# Patient Record
Sex: Female | Born: 1950 | Race: White | Hispanic: No | Marital: Married | Smoking: Never smoker
Health system: Southern US, Community
[De-identification: ages and names within clinical notes are randomized; demographics above are authoritative.]

## PROBLEM LIST (undated history)

## (undated) DIAGNOSIS — Z8619 Personal history of other infectious and parasitic diseases: Secondary | ICD-10-CM

## (undated) DIAGNOSIS — E7800 Pure hypercholesterolemia, unspecified: Secondary | ICD-10-CM

## (undated) DIAGNOSIS — M1990 Unspecified osteoarthritis, unspecified site: Secondary | ICD-10-CM

## (undated) DIAGNOSIS — I2510 Atherosclerotic heart disease of native coronary artery without angina pectoris: Secondary | ICD-10-CM

## (undated) DIAGNOSIS — H18519 Endothelial corneal dystrophy, unspecified eye: Secondary | ICD-10-CM

## (undated) DIAGNOSIS — I10 Essential (primary) hypertension: Secondary | ICD-10-CM

## (undated) DIAGNOSIS — J45909 Unspecified asthma, uncomplicated: Secondary | ICD-10-CM

## (undated) DIAGNOSIS — J309 Allergic rhinitis, unspecified: Secondary | ICD-10-CM

## (undated) DIAGNOSIS — E039 Hypothyroidism, unspecified: Secondary | ICD-10-CM

## (undated) HISTORY — PX: TONSILLECTOMY: SUR1361

---

## 1983-05-15 HISTORY — PX: THYROIDECTOMY: SHX17

## 2004-11-23 ENCOUNTER — Ambulatory Visit: Payer: Self-pay

## 2006-02-06 ENCOUNTER — Ambulatory Visit: Payer: Self-pay | Admitting: Gerontology

## 2007-09-24 ENCOUNTER — Ambulatory Visit: Payer: Self-pay | Admitting: Obstetrics and Gynecology

## 2008-12-15 ENCOUNTER — Ambulatory Visit: Payer: Self-pay | Admitting: Obstetrics and Gynecology

## 2009-02-23 ENCOUNTER — Ambulatory Visit: Payer: Self-pay | Admitting: Gastroenterology

## 2009-12-27 ENCOUNTER — Ambulatory Visit: Payer: Self-pay | Admitting: Obstetrics and Gynecology

## 2011-01-23 ENCOUNTER — Ambulatory Visit: Payer: Self-pay | Admitting: Obstetrics and Gynecology

## 2012-01-29 ENCOUNTER — Ambulatory Visit: Payer: Self-pay | Admitting: Obstetrics and Gynecology

## 2013-02-11 ENCOUNTER — Ambulatory Visit: Payer: Self-pay | Admitting: Obstetrics and Gynecology

## 2014-05-03 ENCOUNTER — Ambulatory Visit: Payer: Self-pay | Admitting: Obstetrics and Gynecology

## 2015-03-31 ENCOUNTER — Other Ambulatory Visit: Payer: Self-pay | Admitting: Obstetrics and Gynecology

## 2015-03-31 DIAGNOSIS — Z1231 Encounter for screening mammogram for malignant neoplasm of breast: Secondary | ICD-10-CM

## 2015-05-05 ENCOUNTER — Ambulatory Visit
Admission: RE | Admit: 2015-05-05 | Discharge: 2015-05-05 | Disposition: A | Discharge status: Home / Self Care | Payer: Managed Care, Other (non HMO) | Source: Ambulatory Visit | Attending: Obstetrics and Gynecology | Admitting: Obstetrics and Gynecology

## 2015-05-05 DIAGNOSIS — Z1231 Encounter for screening mammogram for malignant neoplasm of breast: Secondary | ICD-10-CM | POA: Diagnosis not present

## 2016-04-23 ENCOUNTER — Other Ambulatory Visit: Payer: Self-pay | Admitting: Obstetrics and Gynecology

## 2016-04-23 DIAGNOSIS — Z1231 Encounter for screening mammogram for malignant neoplasm of breast: Secondary | ICD-10-CM

## 2016-05-31 ENCOUNTER — Ambulatory Visit: Payer: Managed Care, Other (non HMO)

## 2016-06-22 ENCOUNTER — Ambulatory Visit
Admission: RE | Admit: 2016-06-22 | Discharge: 2016-06-22 | Disposition: A | Discharge status: Home / Self Care | Payer: Managed Care, Other (non HMO) | Source: Ambulatory Visit | Attending: Obstetrics and Gynecology | Admitting: Obstetrics and Gynecology

## 2016-06-22 DIAGNOSIS — Z1231 Encounter for screening mammogram for malignant neoplasm of breast: Secondary | ICD-10-CM | POA: Insufficient documentation

## 2017-05-16 DIAGNOSIS — J3081 Allergic rhinitis due to animal (cat) (dog) hair and dander: Secondary | ICD-10-CM | POA: Diagnosis not present

## 2017-05-16 DIAGNOSIS — J301 Allergic rhinitis due to pollen: Secondary | ICD-10-CM | POA: Diagnosis not present

## 2017-05-16 DIAGNOSIS — J3089 Other allergic rhinitis: Secondary | ICD-10-CM | POA: Diagnosis not present

## 2017-05-24 DIAGNOSIS — J3081 Allergic rhinitis due to animal (cat) (dog) hair and dander: Secondary | ICD-10-CM | POA: Diagnosis not present

## 2017-05-24 DIAGNOSIS — J3089 Other allergic rhinitis: Secondary | ICD-10-CM | POA: Diagnosis not present

## 2017-05-24 DIAGNOSIS — J301 Allergic rhinitis due to pollen: Secondary | ICD-10-CM | POA: Diagnosis not present

## 2017-05-27 ENCOUNTER — Other Ambulatory Visit: Payer: Self-pay | Admitting: Family Medicine

## 2017-05-27 DIAGNOSIS — Z1231 Encounter for screening mammogram for malignant neoplasm of breast: Secondary | ICD-10-CM

## 2017-05-31 DIAGNOSIS — J301 Allergic rhinitis due to pollen: Secondary | ICD-10-CM | POA: Diagnosis not present

## 2017-05-31 DIAGNOSIS — J3081 Allergic rhinitis due to animal (cat) (dog) hair and dander: Secondary | ICD-10-CM | POA: Diagnosis not present

## 2017-05-31 DIAGNOSIS — J3089 Other allergic rhinitis: Secondary | ICD-10-CM | POA: Diagnosis not present

## 2017-06-07 DIAGNOSIS — J3081 Allergic rhinitis due to animal (cat) (dog) hair and dander: Secondary | ICD-10-CM | POA: Diagnosis not present

## 2017-06-07 DIAGNOSIS — J3089 Other allergic rhinitis: Secondary | ICD-10-CM | POA: Diagnosis not present

## 2017-06-07 DIAGNOSIS — J301 Allergic rhinitis due to pollen: Secondary | ICD-10-CM | POA: Diagnosis not present

## 2017-06-21 DIAGNOSIS — J3081 Allergic rhinitis due to animal (cat) (dog) hair and dander: Secondary | ICD-10-CM | POA: Diagnosis not present

## 2017-06-21 DIAGNOSIS — J301 Allergic rhinitis due to pollen: Secondary | ICD-10-CM | POA: Diagnosis not present

## 2017-06-21 DIAGNOSIS — J3089 Other allergic rhinitis: Secondary | ICD-10-CM | POA: Diagnosis not present

## 2017-06-24 ENCOUNTER — Ambulatory Visit
Admission: RE | Admit: 2017-06-24 | Discharge: 2017-06-24 | Disposition: A | Discharge status: Home / Self Care | Payer: PPO | Source: Ambulatory Visit | Attending: Family Medicine | Admitting: Family Medicine

## 2017-06-24 DIAGNOSIS — Z1231 Encounter for screening mammogram for malignant neoplasm of breast: Secondary | ICD-10-CM | POA: Diagnosis not present

## 2017-06-28 DIAGNOSIS — J301 Allergic rhinitis due to pollen: Secondary | ICD-10-CM | POA: Diagnosis not present

## 2017-06-28 DIAGNOSIS — J3089 Other allergic rhinitis: Secondary | ICD-10-CM | POA: Diagnosis not present

## 2017-06-28 DIAGNOSIS — J3081 Allergic rhinitis due to animal (cat) (dog) hair and dander: Secondary | ICD-10-CM | POA: Diagnosis not present

## 2017-07-05 DIAGNOSIS — J301 Allergic rhinitis due to pollen: Secondary | ICD-10-CM | POA: Diagnosis not present

## 2017-07-05 DIAGNOSIS — J3081 Allergic rhinitis due to animal (cat) (dog) hair and dander: Secondary | ICD-10-CM | POA: Diagnosis not present

## 2017-07-05 DIAGNOSIS — J3089 Other allergic rhinitis: Secondary | ICD-10-CM | POA: Diagnosis not present

## 2017-07-12 DIAGNOSIS — J301 Allergic rhinitis due to pollen: Secondary | ICD-10-CM | POA: Diagnosis not present

## 2017-07-12 DIAGNOSIS — J3089 Other allergic rhinitis: Secondary | ICD-10-CM | POA: Diagnosis not present

## 2017-07-12 DIAGNOSIS — J3081 Allergic rhinitis due to animal (cat) (dog) hair and dander: Secondary | ICD-10-CM | POA: Diagnosis not present

## 2017-07-26 DIAGNOSIS — J3081 Allergic rhinitis due to animal (cat) (dog) hair and dander: Secondary | ICD-10-CM | POA: Diagnosis not present

## 2017-07-26 DIAGNOSIS — J301 Allergic rhinitis due to pollen: Secondary | ICD-10-CM | POA: Diagnosis not present

## 2017-07-26 DIAGNOSIS — J3089 Other allergic rhinitis: Secondary | ICD-10-CM | POA: Diagnosis not present

## 2017-08-02 DIAGNOSIS — J3081 Allergic rhinitis due to animal (cat) (dog) hair and dander: Secondary | ICD-10-CM | POA: Diagnosis not present

## 2017-08-02 DIAGNOSIS — J3089 Other allergic rhinitis: Secondary | ICD-10-CM | POA: Diagnosis not present

## 2017-08-02 DIAGNOSIS — J301 Allergic rhinitis due to pollen: Secondary | ICD-10-CM | POA: Diagnosis not present

## 2017-08-08 DIAGNOSIS — J301 Allergic rhinitis due to pollen: Secondary | ICD-10-CM | POA: Diagnosis not present

## 2017-08-08 DIAGNOSIS — J3089 Other allergic rhinitis: Secondary | ICD-10-CM | POA: Diagnosis not present

## 2017-08-08 DIAGNOSIS — J3081 Allergic rhinitis due to animal (cat) (dog) hair and dander: Secondary | ICD-10-CM | POA: Diagnosis not present

## 2017-08-16 DIAGNOSIS — J3089 Other allergic rhinitis: Secondary | ICD-10-CM | POA: Diagnosis not present

## 2017-08-16 DIAGNOSIS — J301 Allergic rhinitis due to pollen: Secondary | ICD-10-CM | POA: Diagnosis not present

## 2017-08-16 DIAGNOSIS — J3081 Allergic rhinitis due to animal (cat) (dog) hair and dander: Secondary | ICD-10-CM | POA: Diagnosis not present

## 2017-08-19 DIAGNOSIS — J3089 Other allergic rhinitis: Secondary | ICD-10-CM | POA: Diagnosis not present

## 2017-08-19 DIAGNOSIS — J3081 Allergic rhinitis due to animal (cat) (dog) hair and dander: Secondary | ICD-10-CM | POA: Diagnosis not present

## 2017-08-19 DIAGNOSIS — J301 Allergic rhinitis due to pollen: Secondary | ICD-10-CM | POA: Diagnosis not present

## 2017-08-23 DIAGNOSIS — J3081 Allergic rhinitis due to animal (cat) (dog) hair and dander: Secondary | ICD-10-CM | POA: Diagnosis not present

## 2017-08-23 DIAGNOSIS — R002 Palpitations: Secondary | ICD-10-CM | POA: Diagnosis not present

## 2017-08-23 DIAGNOSIS — R0789 Other chest pain: Secondary | ICD-10-CM | POA: Diagnosis not present

## 2017-08-23 DIAGNOSIS — J3089 Other allergic rhinitis: Secondary | ICD-10-CM | POA: Diagnosis not present

## 2017-08-23 DIAGNOSIS — R000 Tachycardia, unspecified: Secondary | ICD-10-CM | POA: Diagnosis not present

## 2017-08-23 DIAGNOSIS — E039 Hypothyroidism, unspecified: Secondary | ICD-10-CM | POA: Diagnosis not present

## 2017-08-23 DIAGNOSIS — J301 Allergic rhinitis due to pollen: Secondary | ICD-10-CM | POA: Diagnosis not present

## 2017-08-29 DIAGNOSIS — J3081 Allergic rhinitis due to animal (cat) (dog) hair and dander: Secondary | ICD-10-CM | POA: Diagnosis not present

## 2017-08-29 DIAGNOSIS — J3089 Other allergic rhinitis: Secondary | ICD-10-CM | POA: Diagnosis not present

## 2017-08-29 DIAGNOSIS — J301 Allergic rhinitis due to pollen: Secondary | ICD-10-CM | POA: Diagnosis not present

## 2017-09-05 DIAGNOSIS — J3081 Allergic rhinitis due to animal (cat) (dog) hair and dander: Secondary | ICD-10-CM | POA: Diagnosis not present

## 2017-09-05 DIAGNOSIS — J3089 Other allergic rhinitis: Secondary | ICD-10-CM | POA: Diagnosis not present

## 2017-09-05 DIAGNOSIS — J301 Allergic rhinitis due to pollen: Secondary | ICD-10-CM | POA: Diagnosis not present

## 2017-09-06 DIAGNOSIS — R0789 Other chest pain: Secondary | ICD-10-CM | POA: Diagnosis not present

## 2017-09-06 DIAGNOSIS — R000 Tachycardia, unspecified: Secondary | ICD-10-CM | POA: Diagnosis not present

## 2017-09-06 DIAGNOSIS — R002 Palpitations: Secondary | ICD-10-CM | POA: Diagnosis not present

## 2017-09-11 DIAGNOSIS — E7800 Pure hypercholesterolemia, unspecified: Secondary | ICD-10-CM | POA: Diagnosis not present

## 2017-09-11 DIAGNOSIS — I10 Essential (primary) hypertension: Secondary | ICD-10-CM | POA: Diagnosis not present

## 2017-09-12 ENCOUNTER — Other Ambulatory Visit: Payer: Self-pay | Admitting: Cardiology

## 2017-09-13 DIAGNOSIS — J301 Allergic rhinitis due to pollen: Secondary | ICD-10-CM | POA: Diagnosis not present

## 2017-09-13 DIAGNOSIS — J3089 Other allergic rhinitis: Secondary | ICD-10-CM | POA: Diagnosis not present

## 2017-09-13 DIAGNOSIS — J3081 Allergic rhinitis due to animal (cat) (dog) hair and dander: Secondary | ICD-10-CM | POA: Diagnosis not present

## 2017-09-17 ENCOUNTER — Ambulatory Visit
Admission: RE | Admit: 2017-09-17 | Discharge: 2017-09-17 | Disposition: A | Discharge status: Home / Self Care | Payer: PPO | Source: Ambulatory Visit | Attending: Cardiology | Admitting: Cardiology

## 2017-09-17 ENCOUNTER — Encounter
Admission: RE | Disposition: A | Discharge status: Home / Self Care | Payer: Self-pay | Source: Ambulatory Visit | Attending: Cardiology

## 2017-09-17 DIAGNOSIS — J45909 Unspecified asthma, uncomplicated: Secondary | ICD-10-CM | POA: Diagnosis not present

## 2017-09-17 DIAGNOSIS — H1851 Endothelial corneal dystrophy: Secondary | ICD-10-CM | POA: Diagnosis not present

## 2017-09-17 DIAGNOSIS — Z79899 Other long term (current) drug therapy: Secondary | ICD-10-CM | POA: Diagnosis not present

## 2017-09-17 DIAGNOSIS — Z79890 Hormone replacement therapy (postmenopausal): Secondary | ICD-10-CM | POA: Diagnosis not present

## 2017-09-17 DIAGNOSIS — E039 Hypothyroidism, unspecified: Secondary | ICD-10-CM | POA: Diagnosis not present

## 2017-09-17 DIAGNOSIS — I10 Essential (primary) hypertension: Secondary | ICD-10-CM | POA: Diagnosis not present

## 2017-09-17 DIAGNOSIS — R0789 Other chest pain: Secondary | ICD-10-CM | POA: Diagnosis not present

## 2017-09-17 DIAGNOSIS — Z8619 Personal history of other infectious and parasitic diseases: Secondary | ICD-10-CM | POA: Insufficient documentation

## 2017-09-17 DIAGNOSIS — R9430 Abnormal result of cardiovascular function study, unspecified: Secondary | ICD-10-CM | POA: Insufficient documentation

## 2017-09-17 DIAGNOSIS — E7800 Pure hypercholesterolemia, unspecified: Secondary | ICD-10-CM | POA: Insufficient documentation

## 2017-09-17 DIAGNOSIS — Z8249 Family history of ischemic heart disease and other diseases of the circulatory system: Secondary | ICD-10-CM | POA: Diagnosis not present

## 2017-09-17 DIAGNOSIS — Z9889 Other specified postprocedural states: Secondary | ICD-10-CM | POA: Insufficient documentation

## 2017-09-17 DIAGNOSIS — R079 Chest pain, unspecified: Secondary | ICD-10-CM | POA: Diagnosis not present

## 2017-09-17 HISTORY — PX: LEFT HEART CATH AND CORONARY ANGIOGRAPHY: CATH118249

## 2017-09-17 SURGERY — LEFT HEART CATH AND CORONARY ANGIOGRAPHY
Anesthesia: Moderate Sedation | Laterality: Left

## 2017-09-17 MED ORDER — SODIUM CHLORIDE 0.9 % IV SOLN: 250.0000 mL | INTRAVENOUS | Status: DC | PRN

## 2017-09-17 MED ORDER — FENTANYL CITRATE (PF) 100 MCG/2ML IJ SOLN
INTRAMUSCULAR | Status: AC
  Filled 2017-09-17: qty 2

## 2017-09-17 MED ORDER — SODIUM CHLORIDE 0.9% FLUSH: 3.0000 mL | Freq: Two times a day (BID) | INTRAVENOUS | Status: DC

## 2017-09-17 MED ORDER — ASPIRIN 81 MG PO CHEW
CHEWABLE_TABLET | ORAL | Status: AC
  Administered 2017-09-17: 81 mg
  Filled 2017-09-17: qty 1

## 2017-09-17 MED ORDER — SODIUM CHLORIDE 0.9% FLUSH: 3.0000 mL | INTRAVENOUS | Status: DC | PRN

## 2017-09-17 MED ORDER — LIDOCAINE HCL (PF) 1 % IJ SOLN
INTRAMUSCULAR | Status: AC
  Filled 2017-09-17: qty 30

## 2017-09-17 MED ORDER — FENTANYL CITRATE (PF) 100 MCG/2ML IJ SOLN
INTRAMUSCULAR | Status: DC | PRN
  Administered 2017-09-17: 25 ug via INTRAVENOUS

## 2017-09-17 MED ORDER — MIDAZOLAM HCL 2 MG/2ML IJ SOLN
INTRAMUSCULAR | Status: AC
  Filled 2017-09-17: qty 2

## 2017-09-17 MED ORDER — SODIUM CHLORIDE 0.9 % WEIGHT BASED INFUSION
3.0000 mL/kg/h | INTRAVENOUS | Status: AC
  Administered 2017-09-17: 3 mL/kg/h via INTRAVENOUS

## 2017-09-17 MED ORDER — ONDANSETRON HCL 4 MG/2ML IJ SOLN: 4.0000 mg | Freq: Four times a day (QID) | INTRAMUSCULAR | Status: DC | PRN

## 2017-09-17 MED ORDER — MIDAZOLAM HCL 2 MG/2ML IJ SOLN
INTRAMUSCULAR | Status: DC | PRN
  Administered 2017-09-17: 1 mg via INTRAVENOUS

## 2017-09-17 MED ORDER — LIDOCAINE HCL (PF) 1 % IJ SOLN
INTRAMUSCULAR | Status: DC | PRN
  Administered 2017-09-17: 18 mL via INTRADERMAL

## 2017-09-17 MED ORDER — IOPAMIDOL (ISOVUE-300) INJECTION 61%
INTRAVENOUS | Status: DC | PRN
  Administered 2017-09-17: 60 mL via INTRA_ARTERIAL

## 2017-09-17 MED ORDER — SODIUM CHLORIDE 0.9 % WEIGHT BASED INFUSION: 1.0000 mL/kg/h | INTRAVENOUS | Status: DC

## 2017-09-17 MED ORDER — ASPIRIN 81 MG PO CHEW: 81.0000 mg | CHEWABLE_TABLET | ORAL | Status: DC

## 2017-09-17 MED ORDER — ACETAMINOPHEN 325 MG PO TABS: 650.0000 mg | ORAL_TABLET | ORAL | Status: DC | PRN

## 2017-09-17 MED ORDER — HEPARIN (PORCINE) IN NACL 1000-0.9 UT/500ML-% IV SOLN
INTRAVENOUS | Status: AC
  Filled 2017-09-17: qty 1000

## 2017-09-17 SURGICAL SUPPLY — 9 items
CATH INFINITI 5FR ANG PIGTAIL (CATHETERS) ×3 IMPLANT
CATH INFINITI 5FR JL4 (CATHETERS) ×3 IMPLANT
CATH INFINITI JR4 5F (CATHETERS) ×3 IMPLANT
DEVICE CLOSURE MYNXGRIP 5F (Vascular Products) ×3 IMPLANT
KIT MANI 3VAL PERCEP (MISCELLANEOUS) ×3 IMPLANT
NEEDLE PERC 18GX7CM (NEEDLE) ×3 IMPLANT
PACK CARDIAC CATH (CUSTOM PROCEDURE TRAY) ×3 IMPLANT
SHEATH AVANTI 5FR X 11CM (SHEATH) ×3 IMPLANT
WIRE GUIDERIGHT .035X150 (WIRE) ×3 IMPLANT

## 2017-09-17 NOTE — Discharge Instructions (Signed)
°Moderate Conscious Sedation, Adult, Care After °These instructions provide you with information about caring for yourself after your procedure. Your health care provider may also give you more specific instructions. Your treatment has been planned according to current medical practices, but problems sometimes occur. Call your health care provider if you have any problems or questions after your procedure. °What can I expect after the procedure? °After your procedure, it is common: °· To feel sleepy for several hours. °· To feel clumsy and have poor balance for several hours. °· To have poor judgment for several hours. °· To vomit if you eat too soon. ° °Follow these instructions at home: °For at least 24 hours after the procedure: ° °· Do not: °? Participate in activities where you could fall or become injured. °? Drive. °? Use heavy machinery. °? Drink alcohol. °? Take sleeping pills or medicines that cause drowsiness. °? Make important decisions or sign legal documents. °? Take care of children on your own. °· Rest. °Eating and drinking °· Follow the diet recommended by your health care provider. °· If you vomit: °? Drink water, juice, or soup when you can drink without vomiting. °? Make sure you have little or no nausea before eating solid foods. °General instructions °· Have a responsible adult stay with you until you are awake and alert. °· Take over-the-counter and prescription medicines only as told by your health care provider. °· If you smoke, do not smoke without supervision. °· Keep all follow-up visits as told by your health care provider. This is important. °Contact a health care provider if: °· You keep feeling nauseous or you keep vomiting. °· You feel light-headed. °· You develop a rash. °· You have a fever. °Get help right away if: °· You have trouble breathing. °This information is not intended to replace advice given to you by your health care provider. Make sure you discuss any questions you  have with your health care provider. °Document Released: 02/18/2013 Document Revised: 10/03/2015 Document Reviewed: 08/20/2015 °Elsevier Interactive Patient Education © 2018 [employer] ° ° ° °Femoral Site Care °Refer to this sheet in the next few weeks. These instructions provide you with information about caring for yourself after your procedure. Your health care provider may also give you more specific instructions. Your treatment has been planned according to current medical practices, but problems sometimes occur. Call your health care provider if you have any problems or questions after your procedure. °What can I expect after the procedure? °After your procedure, it is typical to have the following: °· Bruising at the site that usually fades within 1-2 weeks. °· Blood collecting in the tissue (hematoma) that may be painful to the touch. It should usually decrease in size and tenderness within 1-2 weeks. ° °Follow these instructions at home: °· Take medicines only as directed by your health care provider. °· You may shower 24-48 hours after the procedure or as directed by your health care provider. Remove the bandage (dressing) and gently wash the site with plain soap and water. Pat the area dry with a clean towel. Do not rub the site, because this may cause bleeding. °· Do not take baths, swim, or use a hot tub until your health care provider approves. °· Check your insertion site every day for redness, swelling, or drainage. °· Do not apply powder or lotion to the site. °· Limit use of stairs to twice a day for the first 2-3 days or as directed by your health care   provider. °· Do not squat for the first 2-3 days or as directed by your health care provider. °· Do not lift over 10 lb (4.5 kg) for 5 days after your procedure or as directed by your health care provider. °· Ask your health care provider when it is okay to: °? Return to work or school. °? Resume usual physical activities or sports. °? Resume  sexual activity. °· Do not drive home if you are discharged the same day as the procedure. Have someone else drive you. °· You may drive 24 hours after the procedure unless otherwise instructed by your health care provider. °· Do not operate machinery or power tools for 24 hours after the procedure or as directed by your health care provider. °· If your procedure was done as an outpatient procedure, which means that you went home the same day as your procedure, a responsible adult should be with you for the first 24 hours after you arrive home. °· Keep all follow-up visits as directed by your health care provider. This is important. °Contact a health care provider if: °· You have a fever. °· You have chills. °· You have increased bleeding from the site. Hold pressure on the site. °Get help right away if: °· You have unusual pain at the site. °· You have redness, warmth, or swelling at the site. °· You have drainage (other than a small amount of blood on the dressing) from the site. °· The site is bleeding, and the bleeding does not stop after 30 minutes of holding steady pressure on the site. °· Your leg or foot becomes pale, cool, tingly, or numb. °This information is not intended to replace advice given to you by your health care provider. Make sure you discuss any questions you have with your health care provider. °Document Released: 01/01/2014 Document Revised: 10/06/2015 Document Reviewed: 11/17/2013 °Elsevier Interactive Patient Education © 2018 Elsevier Inc. ° °

## 2017-09-17 NOTE — H&P (Signed)
Chief Complaint: Chief Complaint  Patient presents with  . Establish Care  Abnormal Stress/Echo per Mimi  Date of Service: 09/11/2017 Date of Birth: [birth year] PCP: Dion Body, MD  History of Present Illness: Terri Jefferson is a 67 y.o.female patient who presents in referral for evaluation after being noted to have an abnormal knee. She recently has been noted to have a relative reduced TSH level with some tachycardia. She has noticed some chest tightness both with tachycardia as well as otherwise. She underwent an exercise stress echo which was read as showing inferior and inferoapical hypokinesis with stress. She has been placed on metoprolol with some improvement. She is also on ACE inhibitor for blood pressure. She has a strong family history of heart disease. She denies tobacco use.  Past Medical and Surgical History  Past Medical History Past Medical History:  Diagnosis Date  . Acquired hypothyroidism, unspecified  . Allergic state  . Asthma without status asthmaticus, unspecified  . Essential hypertension  . Fuchs' corneal dystrophy  Followed by [facility]  . History of shingles  . Pure hypercholesterolemia  . Seasonal allergies   Past Surgical History She has a past surgical history that includes thyroidectomy total; tonsilectomy; Tonsillectomy; and Colonoscopy 02/2009.   Medications and Allergies  Current Medications  Current Outpatient Medications  Medication Sig Dispense Refill  . atorvastatin (LIPITOR) 10 MG tablet TAKE 1 TABLET BY MOUTH DAILY 90 tablet 1  . Lactobac no.41/Bifidobact no.7 (PROBIOTIC-10 ORAL) Take 1 tablet by mouth once daily  . levothyroxine (SYNTHROID, LEVOTHROID) 50 MCG tablet Take 1 tablet (50 mcg total) by mouth once daily Take on an empty stomach with a glass of water at least 30-60 minutes before breakfast. 30 tablet 11  . lisinopril-hydrochlorothiazide (PRINZIDE,ZESTORETIC) 10-12.5 mg tablet TAKE 1 TABLET BY MOUTH DAILY 90 tablet 1   . loratadine (ALAVERT) 10 mg dissolvable tablet Take 10 mg by mouth once daily.  . metoprolol succinate (TOPROL-XL) 25 MG XL tablet Take 1 tablet (25 mg total) by mouth once daily 30 tablet 5  . multivitamin tablet Take 1 tablet by mouth once daily.  . polyethylene glycol (MIRALAX) powder Take 17 g by mouth once daily as needed for Constipation. Mix in 4-8ounces of fluid prior to taking.  Marland Kitchen UNABLE TO FIND Allergy Injections qweek  . nitroGLYcerin (NITROSTAT) 0.4 MG SL tablet Place 1 tablet (0.4 mg total) under the tongue every 5 (five) minutes as needed for Chest pain May take up to 3 doses. 25 tablet 11   No current facility-administered medications for this visit.   Allergies: Patient has no known allergies.  Social and Family History  Social History reports that she has never smoked. She has never used smokeless tobacco. She reports that she drinks alcohol. She reports that she does not use drugs.  Family History Family History  Problem Relation Age of Onset  . Ovarian cancer Maternal Aunt  . Heart disease Mother  . High blood pressure (Hypertension) Mother   Review of Systems  Review of Systems  Constitutional: Negative for chills, diaphoresis, fever, malaise/fatigue and weight loss.  HENT: Negative for congestion, ear discharge, hearing loss and tinnitus.  Eyes: Negative for blurred vision.  Respiratory: Negative for cough, hemoptysis, sputum production, shortness of breath and wheezing.  Cardiovascular: Positive for chest pain and palpitations. Negative for orthopnea, claudication, leg swelling and PND.  Gastrointestinal: Negative for abdominal pain, blood in stool, constipation, diarrhea, heartburn, melena, nausea and vomiting.  Genitourinary: Negative for dysuria, frequency, hematuria and  urgency.  Musculoskeletal: Negative for back pain, falls, joint pain and myalgias.  Skin: Negative for itching and rash.  Neurological: Negative for dizziness, tingling, focal weakness,  loss of consciousness, weakness and headaches.  Endo/Heme/Allergies: Negative for polydipsia. Does not bruise/bleed easily.  Psychiatric/Behavioral: Negative for depression, memory loss and substance abuse. The patient is not nervous/anxious.   Physical Examination   Vitals:BP 124/78  Pulse 84  Ht 160 cm (5\' 3" )  Wt 63.1 kg (139 lb 3.2 oz)  BMI 24.66 kg/m  Ht:160 cm (5\' 3" ) Wt:63.1 kg (139 lb 3.2 oz) SWN:IOEV surface area is 1.67 meters squared. Body mass index is 24.66 kg/m.  Wt Readings from Last 3 Encounters:  09/11/17 63.1 kg (139 lb 3.2 oz)  08/23/17 65 kg (143 lb 3.2 oz)  04/09/17 67.5 kg (148 lb 12.8 oz)   BP Readings from Last 3 Encounters:  09/11/17 124/78  08/23/17 138/82  04/09/17 146/76   General appearance appears in no acute distress  Head Mouth and Eye exam Normocephalic, without obvious abnormality, atraumatic Dentition is good Eyes appear anicteric   Neck exam Thyroid: normal  Nodes: no obvious adenopathy  LUNGS Breath Sounds: Normal Percussion: Normal  CARDIOVASCULAR JVP CV wave: no HJR: no Elevation at 90 degrees: None Carotid Pulse: normal pulsation bilaterally Bruit: None Apex: apical impulse normal  Auscultation Rhythm: normal sinus rhythm S1: normal S2: normal Clicks: no Rub: no Murmurs: no murmurs  Gallop: None ABDOMEN Liver enlargement: no Pulsatile aorta: no Ascites: no Bruits: no  EXTREMITIES Clubbing: no Edema: trace to 1+ bilateral pedal edema Pulses: peripheral pulses symmetrical Femoral Bruits: no Amputation: no SKIN Rash: no Cyanosis: no Embolic phemonenon: no Bruising: no NEURO Alert and Oriented to person, place and time: yes Non focal: yes  PSYCH: Pt appears to have normal affect  LABS REVIEWED Last 3 CBC results: Lab Results  Component Value Date  WBC 7.2 08/23/2017  WBC 7.6 04/02/2017  WBC 11.7 (H) 07/30/2016   Lab Results  Component Value Date  HGB 13.0 08/23/2017  HGB 12.8 04/02/2017   HGB 12.6 07/30/2016   Lab Results  Component Value Date  HCT 38.6 08/23/2017  HCT 38.0 04/02/2017  HCT 36.1 07/30/2016   Lab Results  Component Value Date  PLT 256 08/23/2017  PLT 241 04/02/2017  PLT 263 07/30/2016   Lab Results  Component Value Date  CREATININE 1.0 08/23/2017  BUN 21 08/23/2017  NA 137 08/23/2017  K 4.1 08/23/2017  CL 103 08/23/2017  CO2 26.5 08/23/2017   Lab Results  Component Value Date  HGBA1C 5.7 (H) 04/02/2017   Lab Results  Component Value Date  HDL 58.4 04/02/2017  HDL 55.9 09/27/2016  HDL 61.3 03/30/2016   Lab Results  Component Value Date  LDLCALC 97 04/02/2017  LDLCALC 98 09/27/2016  LDLCALC 118 03/30/2016   Lab Results  Component Value Date  TRIG 138 04/02/2017  TRIG 120 09/27/2016  TRIG 76 03/30/2016   Lab Results  Component Value Date  ALT 14 08/23/2017  AST 19 08/23/2017  ALKPHOS 44 08/23/2017   Lab Results  Component Value Date  TSH 0.309 (L) 08/23/2017   Diagnostic Studies Reviewed:  EKG EKG demonstrated normal sinus rhythm, nonspecific ST and T waves changes.  Assessment and Plan   67 y.o. female with  ICD-10-CM ICD-9-CM  1. Pure hypercholesterolemia (LDL 97 - 04/02/17)-LDL goal less than 100. Recommend staying on atorvastatin at 10 mg daily and low-fat diet. E78.00 272.0  2. Essential hypertension-remain on metoprolol and lisinopril-hydrochlorthiazide. Low-sodium diet  recommended. I10 401.9   3. Chest pain-etiology unclear. Stress echo read as showing inferior ischemia. Given strong family history as well as hypertension hyperlipidemia, will proceed with left heart cath to evaluate coronary anatomy guide further therapy. Risk and benefits were explained to the patient. We will give her sublingual nitro to take in addition to metoprolol for antianginal therapy. Further recognitions after cardiac cath.  Return in about 1 month (around 10/09/2017).  These notes generated with voice recognition software. I  apologize for typographical errors.  Sydnee Levans, MD    Pt seen and examined. No change from above.

## 2017-09-20 DIAGNOSIS — J301 Allergic rhinitis due to pollen: Secondary | ICD-10-CM | POA: Diagnosis not present

## 2017-09-20 DIAGNOSIS — J3081 Allergic rhinitis due to animal (cat) (dog) hair and dander: Secondary | ICD-10-CM | POA: Diagnosis not present

## 2017-09-20 DIAGNOSIS — J3089 Other allergic rhinitis: Secondary | ICD-10-CM | POA: Diagnosis not present

## 2017-09-23 DIAGNOSIS — I10 Essential (primary) hypertension: Secondary | ICD-10-CM | POA: Diagnosis not present

## 2017-09-23 DIAGNOSIS — E7800 Pure hypercholesterolemia, unspecified: Secondary | ICD-10-CM | POA: Diagnosis not present

## 2017-09-27 DIAGNOSIS — J301 Allergic rhinitis due to pollen: Secondary | ICD-10-CM | POA: Diagnosis not present

## 2017-09-27 DIAGNOSIS — J3081 Allergic rhinitis due to animal (cat) (dog) hair and dander: Secondary | ICD-10-CM | POA: Diagnosis not present

## 2017-09-27 DIAGNOSIS — J3089 Other allergic rhinitis: Secondary | ICD-10-CM | POA: Diagnosis not present

## 2017-10-03 DIAGNOSIS — R7303 Prediabetes: Secondary | ICD-10-CM | POA: Diagnosis not present

## 2017-10-03 DIAGNOSIS — E7800 Pure hypercholesterolemia, unspecified: Secondary | ICD-10-CM | POA: Diagnosis not present

## 2017-10-04 DIAGNOSIS — J3081 Allergic rhinitis due to animal (cat) (dog) hair and dander: Secondary | ICD-10-CM | POA: Diagnosis not present

## 2017-10-04 DIAGNOSIS — J301 Allergic rhinitis due to pollen: Secondary | ICD-10-CM | POA: Diagnosis not present

## 2017-10-04 DIAGNOSIS — J3089 Other allergic rhinitis: Secondary | ICD-10-CM | POA: Diagnosis not present

## 2017-10-10 DIAGNOSIS — J3081 Allergic rhinitis due to animal (cat) (dog) hair and dander: Secondary | ICD-10-CM | POA: Diagnosis not present

## 2017-10-10 DIAGNOSIS — R7303 Prediabetes: Secondary | ICD-10-CM | POA: Diagnosis not present

## 2017-10-10 DIAGNOSIS — E7800 Pure hypercholesterolemia, unspecified: Secondary | ICD-10-CM | POA: Diagnosis not present

## 2017-10-10 DIAGNOSIS — Z0000 Encounter for general adult medical examination without abnormal findings: Secondary | ICD-10-CM | POA: Diagnosis not present

## 2017-10-10 DIAGNOSIS — I10 Essential (primary) hypertension: Secondary | ICD-10-CM | POA: Diagnosis not present

## 2017-10-10 DIAGNOSIS — J3089 Other allergic rhinitis: Secondary | ICD-10-CM | POA: Diagnosis not present

## 2017-10-10 DIAGNOSIS — Z780 Asymptomatic menopausal state: Secondary | ICD-10-CM | POA: Diagnosis not present

## 2017-10-10 DIAGNOSIS — J301 Allergic rhinitis due to pollen: Secondary | ICD-10-CM | POA: Diagnosis not present

## 2017-10-10 DIAGNOSIS — E039 Hypothyroidism, unspecified: Secondary | ICD-10-CM | POA: Diagnosis not present

## 2017-10-18 DIAGNOSIS — J3089 Other allergic rhinitis: Secondary | ICD-10-CM | POA: Diagnosis not present

## 2017-10-18 DIAGNOSIS — J3081 Allergic rhinitis due to animal (cat) (dog) hair and dander: Secondary | ICD-10-CM | POA: Diagnosis not present

## 2017-10-18 DIAGNOSIS — J301 Allergic rhinitis due to pollen: Secondary | ICD-10-CM | POA: Diagnosis not present

## 2017-10-23 DIAGNOSIS — Z780 Asymptomatic menopausal state: Secondary | ICD-10-CM | POA: Diagnosis not present

## 2017-10-25 DIAGNOSIS — J301 Allergic rhinitis due to pollen: Secondary | ICD-10-CM | POA: Diagnosis not present

## 2017-10-25 DIAGNOSIS — J3081 Allergic rhinitis due to animal (cat) (dog) hair and dander: Secondary | ICD-10-CM | POA: Diagnosis not present

## 2017-10-25 DIAGNOSIS — J3089 Other allergic rhinitis: Secondary | ICD-10-CM | POA: Diagnosis not present

## 2017-11-01 DIAGNOSIS — J3089 Other allergic rhinitis: Secondary | ICD-10-CM | POA: Diagnosis not present

## 2017-11-01 DIAGNOSIS — J3081 Allergic rhinitis due to animal (cat) (dog) hair and dander: Secondary | ICD-10-CM | POA: Diagnosis not present

## 2017-11-01 DIAGNOSIS — J301 Allergic rhinitis due to pollen: Secondary | ICD-10-CM | POA: Diagnosis not present

## 2017-11-08 DIAGNOSIS — J3089 Other allergic rhinitis: Secondary | ICD-10-CM | POA: Diagnosis not present

## 2017-11-08 DIAGNOSIS — J3081 Allergic rhinitis due to animal (cat) (dog) hair and dander: Secondary | ICD-10-CM | POA: Diagnosis not present

## 2017-11-08 DIAGNOSIS — J301 Allergic rhinitis due to pollen: Secondary | ICD-10-CM | POA: Diagnosis not present

## 2017-11-12 DIAGNOSIS — J3081 Allergic rhinitis due to animal (cat) (dog) hair and dander: Secondary | ICD-10-CM | POA: Diagnosis not present

## 2017-11-12 DIAGNOSIS — J301 Allergic rhinitis due to pollen: Secondary | ICD-10-CM | POA: Diagnosis not present

## 2017-11-12 DIAGNOSIS — J3089 Other allergic rhinitis: Secondary | ICD-10-CM | POA: Diagnosis not present

## 2017-11-21 DIAGNOSIS — J3081 Allergic rhinitis due to animal (cat) (dog) hair and dander: Secondary | ICD-10-CM | POA: Diagnosis not present

## 2017-11-21 DIAGNOSIS — J301 Allergic rhinitis due to pollen: Secondary | ICD-10-CM | POA: Diagnosis not present

## 2017-11-21 DIAGNOSIS — J3089 Other allergic rhinitis: Secondary | ICD-10-CM | POA: Diagnosis not present

## 2017-11-29 DIAGNOSIS — J3081 Allergic rhinitis due to animal (cat) (dog) hair and dander: Secondary | ICD-10-CM | POA: Diagnosis not present

## 2017-11-29 DIAGNOSIS — J301 Allergic rhinitis due to pollen: Secondary | ICD-10-CM | POA: Diagnosis not present

## 2017-11-29 DIAGNOSIS — J3089 Other allergic rhinitis: Secondary | ICD-10-CM | POA: Diagnosis not present

## 2017-12-06 DIAGNOSIS — J301 Allergic rhinitis due to pollen: Secondary | ICD-10-CM | POA: Diagnosis not present

## 2017-12-06 DIAGNOSIS — J3081 Allergic rhinitis due to animal (cat) (dog) hair and dander: Secondary | ICD-10-CM | POA: Diagnosis not present

## 2017-12-06 DIAGNOSIS — J3089 Other allergic rhinitis: Secondary | ICD-10-CM | POA: Diagnosis not present

## 2017-12-13 DIAGNOSIS — J3081 Allergic rhinitis due to animal (cat) (dog) hair and dander: Secondary | ICD-10-CM | POA: Diagnosis not present

## 2017-12-13 DIAGNOSIS — J301 Allergic rhinitis due to pollen: Secondary | ICD-10-CM | POA: Diagnosis not present

## 2017-12-13 DIAGNOSIS — J3089 Other allergic rhinitis: Secondary | ICD-10-CM | POA: Diagnosis not present

## 2017-12-20 DIAGNOSIS — J3089 Other allergic rhinitis: Secondary | ICD-10-CM | POA: Diagnosis not present

## 2017-12-20 DIAGNOSIS — J3081 Allergic rhinitis due to animal (cat) (dog) hair and dander: Secondary | ICD-10-CM | POA: Diagnosis not present

## 2017-12-20 DIAGNOSIS — J301 Allergic rhinitis due to pollen: Secondary | ICD-10-CM | POA: Diagnosis not present

## 2017-12-27 DIAGNOSIS — J301 Allergic rhinitis due to pollen: Secondary | ICD-10-CM | POA: Diagnosis not present

## 2017-12-27 DIAGNOSIS — J3089 Other allergic rhinitis: Secondary | ICD-10-CM | POA: Diagnosis not present

## 2017-12-27 DIAGNOSIS — J3081 Allergic rhinitis due to animal (cat) (dog) hair and dander: Secondary | ICD-10-CM | POA: Diagnosis not present

## 2017-12-31 ENCOUNTER — Other Ambulatory Visit: Payer: Self-pay | Admitting: *Deleted

## 2017-12-31 NOTE — Patient Outreach (Signed)
Telephone call to pt for HTA health risk assessment completed by pt, spoke with pt, HIPAA verified, pt reports hypertension well controlled, pt sees Dr. Netty Starring at [facility] in [place], pt reports she has no needs for care management, RN CM mailed successful outreach letter including 24 hour nurse line magnet to pt home.  Terri Jefferson [facility], [place] Coordinator [phone]

## 2018-01-03 DIAGNOSIS — J301 Allergic rhinitis due to pollen: Secondary | ICD-10-CM | POA: Diagnosis not present

## 2018-01-03 DIAGNOSIS — J3089 Other allergic rhinitis: Secondary | ICD-10-CM | POA: Diagnosis not present

## 2018-01-03 DIAGNOSIS — J3081 Allergic rhinitis due to animal (cat) (dog) hair and dander: Secondary | ICD-10-CM | POA: Diagnosis not present

## 2018-01-10 DIAGNOSIS — J3081 Allergic rhinitis due to animal (cat) (dog) hair and dander: Secondary | ICD-10-CM | POA: Diagnosis not present

## 2018-01-10 DIAGNOSIS — J3089 Other allergic rhinitis: Secondary | ICD-10-CM | POA: Diagnosis not present

## 2018-01-10 DIAGNOSIS — J301 Allergic rhinitis due to pollen: Secondary | ICD-10-CM | POA: Diagnosis not present

## 2018-01-17 DIAGNOSIS — J3089 Other allergic rhinitis: Secondary | ICD-10-CM | POA: Diagnosis not present

## 2018-01-17 DIAGNOSIS — J301 Allergic rhinitis due to pollen: Secondary | ICD-10-CM | POA: Diagnosis not present

## 2018-01-17 DIAGNOSIS — J3081 Allergic rhinitis due to animal (cat) (dog) hair and dander: Secondary | ICD-10-CM | POA: Diagnosis not present

## 2018-01-28 DIAGNOSIS — J4520 Mild intermittent asthma, uncomplicated: Secondary | ICD-10-CM | POA: Diagnosis not present

## 2018-01-28 DIAGNOSIS — J301 Allergic rhinitis due to pollen: Secondary | ICD-10-CM | POA: Diagnosis not present

## 2018-01-28 DIAGNOSIS — J3081 Allergic rhinitis due to animal (cat) (dog) hair and dander: Secondary | ICD-10-CM | POA: Diagnosis not present

## 2018-01-28 DIAGNOSIS — J3089 Other allergic rhinitis: Secondary | ICD-10-CM | POA: Diagnosis not present

## 2018-02-03 DIAGNOSIS — J3081 Allergic rhinitis due to animal (cat) (dog) hair and dander: Secondary | ICD-10-CM | POA: Diagnosis not present

## 2018-02-03 DIAGNOSIS — J301 Allergic rhinitis due to pollen: Secondary | ICD-10-CM | POA: Diagnosis not present

## 2018-02-03 DIAGNOSIS — J3089 Other allergic rhinitis: Secondary | ICD-10-CM | POA: Diagnosis not present

## 2018-02-04 DIAGNOSIS — Z780 Asymptomatic menopausal state: Secondary | ICD-10-CM | POA: Diagnosis not present

## 2018-02-04 DIAGNOSIS — R7303 Prediabetes: Secondary | ICD-10-CM | POA: Diagnosis not present

## 2018-02-04 DIAGNOSIS — M8580 Other specified disorders of bone density and structure, unspecified site: Secondary | ICD-10-CM | POA: Diagnosis not present

## 2018-02-04 DIAGNOSIS — E7800 Pure hypercholesterolemia, unspecified: Secondary | ICD-10-CM | POA: Diagnosis not present

## 2018-02-04 DIAGNOSIS — I10 Essential (primary) hypertension: Secondary | ICD-10-CM | POA: Diagnosis not present

## 2018-02-07 DIAGNOSIS — J3081 Allergic rhinitis due to animal (cat) (dog) hair and dander: Secondary | ICD-10-CM | POA: Diagnosis not present

## 2018-02-07 DIAGNOSIS — J301 Allergic rhinitis due to pollen: Secondary | ICD-10-CM | POA: Diagnosis not present

## 2018-02-07 DIAGNOSIS — J3089 Other allergic rhinitis: Secondary | ICD-10-CM | POA: Diagnosis not present

## 2018-02-11 DIAGNOSIS — Z23 Encounter for immunization: Secondary | ICD-10-CM | POA: Diagnosis not present

## 2018-02-11 DIAGNOSIS — E039 Hypothyroidism, unspecified: Secondary | ICD-10-CM | POA: Diagnosis not present

## 2018-02-11 DIAGNOSIS — R7303 Prediabetes: Secondary | ICD-10-CM | POA: Diagnosis not present

## 2018-02-11 DIAGNOSIS — I10 Essential (primary) hypertension: Secondary | ICD-10-CM | POA: Diagnosis not present

## 2018-02-11 DIAGNOSIS — E7800 Pure hypercholesterolemia, unspecified: Secondary | ICD-10-CM | POA: Diagnosis not present

## 2018-02-14 DIAGNOSIS — J3089 Other allergic rhinitis: Secondary | ICD-10-CM | POA: Diagnosis not present

## 2018-02-14 DIAGNOSIS — J3081 Allergic rhinitis due to animal (cat) (dog) hair and dander: Secondary | ICD-10-CM | POA: Diagnosis not present

## 2018-02-14 DIAGNOSIS — J301 Allergic rhinitis due to pollen: Secondary | ICD-10-CM | POA: Diagnosis not present

## 2018-03-14 DIAGNOSIS — J301 Allergic rhinitis due to pollen: Secondary | ICD-10-CM | POA: Diagnosis not present

## 2018-03-14 DIAGNOSIS — J3089 Other allergic rhinitis: Secondary | ICD-10-CM | POA: Diagnosis not present

## 2018-03-14 DIAGNOSIS — J3081 Allergic rhinitis due to animal (cat) (dog) hair and dander: Secondary | ICD-10-CM | POA: Diagnosis not present

## 2018-03-21 DIAGNOSIS — J3081 Allergic rhinitis due to animal (cat) (dog) hair and dander: Secondary | ICD-10-CM | POA: Diagnosis not present

## 2018-03-21 DIAGNOSIS — J301 Allergic rhinitis due to pollen: Secondary | ICD-10-CM | POA: Diagnosis not present

## 2018-03-28 DIAGNOSIS — J3089 Other allergic rhinitis: Secondary | ICD-10-CM | POA: Diagnosis not present

## 2018-03-28 DIAGNOSIS — J3081 Allergic rhinitis due to animal (cat) (dog) hair and dander: Secondary | ICD-10-CM | POA: Diagnosis not present

## 2018-03-28 DIAGNOSIS — J301 Allergic rhinitis due to pollen: Secondary | ICD-10-CM | POA: Diagnosis not present

## 2018-04-01 DIAGNOSIS — J301 Allergic rhinitis due to pollen: Secondary | ICD-10-CM | POA: Diagnosis not present

## 2018-04-01 DIAGNOSIS — J3089 Other allergic rhinitis: Secondary | ICD-10-CM | POA: Diagnosis not present

## 2018-04-01 DIAGNOSIS — J3081 Allergic rhinitis due to animal (cat) (dog) hair and dander: Secondary | ICD-10-CM | POA: Diagnosis not present

## 2018-04-04 DIAGNOSIS — J3081 Allergic rhinitis due to animal (cat) (dog) hair and dander: Secondary | ICD-10-CM | POA: Diagnosis not present

## 2018-04-04 DIAGNOSIS — J301 Allergic rhinitis due to pollen: Secondary | ICD-10-CM | POA: Diagnosis not present

## 2018-04-04 DIAGNOSIS — J3089 Other allergic rhinitis: Secondary | ICD-10-CM | POA: Diagnosis not present

## 2018-04-08 DIAGNOSIS — J3089 Other allergic rhinitis: Secondary | ICD-10-CM | POA: Diagnosis not present

## 2018-04-08 DIAGNOSIS — J3081 Allergic rhinitis due to animal (cat) (dog) hair and dander: Secondary | ICD-10-CM | POA: Diagnosis not present

## 2018-04-08 DIAGNOSIS — J301 Allergic rhinitis due to pollen: Secondary | ICD-10-CM | POA: Diagnosis not present

## 2018-04-18 DIAGNOSIS — J3089 Other allergic rhinitis: Secondary | ICD-10-CM | POA: Diagnosis not present

## 2018-04-18 DIAGNOSIS — J301 Allergic rhinitis due to pollen: Secondary | ICD-10-CM | POA: Diagnosis not present

## 2018-04-18 DIAGNOSIS — J3081 Allergic rhinitis due to animal (cat) (dog) hair and dander: Secondary | ICD-10-CM | POA: Diagnosis not present

## 2018-04-25 DIAGNOSIS — J3089 Other allergic rhinitis: Secondary | ICD-10-CM | POA: Diagnosis not present

## 2018-04-25 DIAGNOSIS — J3081 Allergic rhinitis due to animal (cat) (dog) hair and dander: Secondary | ICD-10-CM | POA: Diagnosis not present

## 2018-04-25 DIAGNOSIS — J301 Allergic rhinitis due to pollen: Secondary | ICD-10-CM | POA: Diagnosis not present

## 2018-05-02 DIAGNOSIS — J301 Allergic rhinitis due to pollen: Secondary | ICD-10-CM | POA: Diagnosis not present

## 2018-05-02 DIAGNOSIS — J3081 Allergic rhinitis due to animal (cat) (dog) hair and dander: Secondary | ICD-10-CM | POA: Diagnosis not present

## 2018-05-02 DIAGNOSIS — J3089 Other allergic rhinitis: Secondary | ICD-10-CM | POA: Diagnosis not present

## 2018-05-09 DIAGNOSIS — J301 Allergic rhinitis due to pollen: Secondary | ICD-10-CM | POA: Diagnosis not present

## 2018-05-09 DIAGNOSIS — J3089 Other allergic rhinitis: Secondary | ICD-10-CM | POA: Diagnosis not present

## 2018-05-09 DIAGNOSIS — J3081 Allergic rhinitis due to animal (cat) (dog) hair and dander: Secondary | ICD-10-CM | POA: Diagnosis not present

## 2018-05-16 ENCOUNTER — Other Ambulatory Visit: Payer: Self-pay | Admitting: Family Medicine

## 2018-05-16 DIAGNOSIS — J301 Allergic rhinitis due to pollen: Secondary | ICD-10-CM | POA: Diagnosis not present

## 2018-05-16 DIAGNOSIS — J3089 Other allergic rhinitis: Secondary | ICD-10-CM | POA: Diagnosis not present

## 2018-05-16 DIAGNOSIS — Z1231 Encounter for screening mammogram for malignant neoplasm of breast: Secondary | ICD-10-CM

## 2018-05-23 DIAGNOSIS — J3081 Allergic rhinitis due to animal (cat) (dog) hair and dander: Secondary | ICD-10-CM | POA: Diagnosis not present

## 2018-05-23 DIAGNOSIS — J301 Allergic rhinitis due to pollen: Secondary | ICD-10-CM | POA: Diagnosis not present

## 2018-05-23 DIAGNOSIS — J3089 Other allergic rhinitis: Secondary | ICD-10-CM | POA: Diagnosis not present

## 2018-05-30 DIAGNOSIS — J301 Allergic rhinitis due to pollen: Secondary | ICD-10-CM | POA: Diagnosis not present

## 2018-05-30 DIAGNOSIS — J3089 Other allergic rhinitis: Secondary | ICD-10-CM | POA: Diagnosis not present

## 2018-06-06 DIAGNOSIS — J3081 Allergic rhinitis due to animal (cat) (dog) hair and dander: Secondary | ICD-10-CM | POA: Diagnosis not present

## 2018-06-06 DIAGNOSIS — J3089 Other allergic rhinitis: Secondary | ICD-10-CM | POA: Diagnosis not present

## 2018-06-06 DIAGNOSIS — J301 Allergic rhinitis due to pollen: Secondary | ICD-10-CM | POA: Diagnosis not present

## 2018-06-13 DIAGNOSIS — J301 Allergic rhinitis due to pollen: Secondary | ICD-10-CM | POA: Diagnosis not present

## 2018-06-13 DIAGNOSIS — J3089 Other allergic rhinitis: Secondary | ICD-10-CM | POA: Diagnosis not present

## 2018-06-13 DIAGNOSIS — J3081 Allergic rhinitis due to animal (cat) (dog) hair and dander: Secondary | ICD-10-CM | POA: Diagnosis not present

## 2018-06-20 DIAGNOSIS — J3081 Allergic rhinitis due to animal (cat) (dog) hair and dander: Secondary | ICD-10-CM | POA: Diagnosis not present

## 2018-06-20 DIAGNOSIS — J301 Allergic rhinitis due to pollen: Secondary | ICD-10-CM | POA: Diagnosis not present

## 2018-06-20 DIAGNOSIS — J3089 Other allergic rhinitis: Secondary | ICD-10-CM | POA: Diagnosis not present

## 2018-06-25 ENCOUNTER — Ambulatory Visit
Admission: RE | Admit: 2018-06-25 | Discharge: 2018-06-25 | Disposition: A | Discharge status: Home / Self Care | Payer: PPO | Source: Ambulatory Visit | Attending: Family Medicine | Admitting: Family Medicine

## 2018-06-25 DIAGNOSIS — Z1231 Encounter for screening mammogram for malignant neoplasm of breast: Secondary | ICD-10-CM | POA: Diagnosis not present

## 2018-06-27 DIAGNOSIS — J3089 Other allergic rhinitis: Secondary | ICD-10-CM | POA: Diagnosis not present

## 2018-06-27 DIAGNOSIS — J301 Allergic rhinitis due to pollen: Secondary | ICD-10-CM | POA: Diagnosis not present

## 2018-06-27 DIAGNOSIS — J3081 Allergic rhinitis due to animal (cat) (dog) hair and dander: Secondary | ICD-10-CM | POA: Diagnosis not present

## 2018-07-04 DIAGNOSIS — J3089 Other allergic rhinitis: Secondary | ICD-10-CM | POA: Diagnosis not present

## 2018-07-04 DIAGNOSIS — J301 Allergic rhinitis due to pollen: Secondary | ICD-10-CM | POA: Diagnosis not present

## 2018-07-04 DIAGNOSIS — J3081 Allergic rhinitis due to animal (cat) (dog) hair and dander: Secondary | ICD-10-CM | POA: Diagnosis not present

## 2018-07-11 DIAGNOSIS — J3089 Other allergic rhinitis: Secondary | ICD-10-CM | POA: Diagnosis not present

## 2018-07-11 DIAGNOSIS — J3081 Allergic rhinitis due to animal (cat) (dog) hair and dander: Secondary | ICD-10-CM | POA: Diagnosis not present

## 2018-07-11 DIAGNOSIS — J301 Allergic rhinitis due to pollen: Secondary | ICD-10-CM | POA: Diagnosis not present

## 2018-07-18 DIAGNOSIS — J3081 Allergic rhinitis due to animal (cat) (dog) hair and dander: Secondary | ICD-10-CM | POA: Diagnosis not present

## 2018-07-18 DIAGNOSIS — J301 Allergic rhinitis due to pollen: Secondary | ICD-10-CM | POA: Diagnosis not present

## 2018-07-18 DIAGNOSIS — J3089 Other allergic rhinitis: Secondary | ICD-10-CM | POA: Diagnosis not present

## 2018-07-22 DIAGNOSIS — R0789 Other chest pain: Secondary | ICD-10-CM | POA: Diagnosis not present

## 2018-07-22 DIAGNOSIS — I10 Essential (primary) hypertension: Secondary | ICD-10-CM | POA: Diagnosis not present

## 2018-07-22 DIAGNOSIS — M85852 Other specified disorders of bone density and structure, left thigh: Secondary | ICD-10-CM | POA: Diagnosis not present

## 2018-07-22 DIAGNOSIS — E039 Hypothyroidism, unspecified: Secondary | ICD-10-CM | POA: Diagnosis not present

## 2018-07-22 DIAGNOSIS — R079 Chest pain, unspecified: Secondary | ICD-10-CM | POA: Diagnosis not present

## 2018-07-22 DIAGNOSIS — N8110 Cystocele, unspecified: Secondary | ICD-10-CM | POA: Diagnosis not present

## 2018-07-24 DIAGNOSIS — J3081 Allergic rhinitis due to animal (cat) (dog) hair and dander: Secondary | ICD-10-CM | POA: Diagnosis not present

## 2018-07-24 DIAGNOSIS — J301 Allergic rhinitis due to pollen: Secondary | ICD-10-CM | POA: Diagnosis not present

## 2018-07-24 DIAGNOSIS — J3089 Other allergic rhinitis: Secondary | ICD-10-CM | POA: Diagnosis not present

## 2018-07-25 DIAGNOSIS — J3089 Other allergic rhinitis: Secondary | ICD-10-CM | POA: Diagnosis not present

## 2018-07-25 DIAGNOSIS — J3081 Allergic rhinitis due to animal (cat) (dog) hair and dander: Secondary | ICD-10-CM | POA: Diagnosis not present

## 2018-07-25 DIAGNOSIS — J301 Allergic rhinitis due to pollen: Secondary | ICD-10-CM | POA: Diagnosis not present

## 2018-07-30 DIAGNOSIS — R946 Abnormal results of thyroid function studies: Secondary | ICD-10-CM | POA: Diagnosis not present

## 2018-07-30 DIAGNOSIS — R0789 Other chest pain: Secondary | ICD-10-CM | POA: Diagnosis not present

## 2018-08-01 DIAGNOSIS — J3089 Other allergic rhinitis: Secondary | ICD-10-CM | POA: Diagnosis not present

## 2018-08-01 DIAGNOSIS — J301 Allergic rhinitis due to pollen: Secondary | ICD-10-CM | POA: Diagnosis not present

## 2018-08-01 DIAGNOSIS — J3081 Allergic rhinitis due to animal (cat) (dog) hair and dander: Secondary | ICD-10-CM | POA: Diagnosis not present

## 2018-08-08 DIAGNOSIS — J3089 Other allergic rhinitis: Secondary | ICD-10-CM | POA: Diagnosis not present

## 2018-08-08 DIAGNOSIS — J301 Allergic rhinitis due to pollen: Secondary | ICD-10-CM | POA: Diagnosis not present

## 2018-08-08 DIAGNOSIS — J3081 Allergic rhinitis due to animal (cat) (dog) hair and dander: Secondary | ICD-10-CM | POA: Diagnosis not present

## 2018-08-15 DIAGNOSIS — J301 Allergic rhinitis due to pollen: Secondary | ICD-10-CM | POA: Diagnosis not present

## 2018-08-15 DIAGNOSIS — J3081 Allergic rhinitis due to animal (cat) (dog) hair and dander: Secondary | ICD-10-CM | POA: Diagnosis not present

## 2018-08-15 DIAGNOSIS — J3089 Other allergic rhinitis: Secondary | ICD-10-CM | POA: Diagnosis not present

## 2018-08-19 DIAGNOSIS — J3089 Other allergic rhinitis: Secondary | ICD-10-CM | POA: Diagnosis not present

## 2018-08-19 DIAGNOSIS — J301 Allergic rhinitis due to pollen: Secondary | ICD-10-CM | POA: Diagnosis not present

## 2018-08-19 DIAGNOSIS — J3081 Allergic rhinitis due to animal (cat) (dog) hair and dander: Secondary | ICD-10-CM | POA: Diagnosis not present

## 2018-08-26 DIAGNOSIS — J301 Allergic rhinitis due to pollen: Secondary | ICD-10-CM | POA: Diagnosis not present

## 2018-08-26 DIAGNOSIS — J3081 Allergic rhinitis due to animal (cat) (dog) hair and dander: Secondary | ICD-10-CM | POA: Diagnosis not present

## 2018-08-26 DIAGNOSIS — J3089 Other allergic rhinitis: Secondary | ICD-10-CM | POA: Diagnosis not present

## 2018-08-29 DIAGNOSIS — J3081 Allergic rhinitis due to animal (cat) (dog) hair and dander: Secondary | ICD-10-CM | POA: Diagnosis not present

## 2018-08-29 DIAGNOSIS — J3089 Other allergic rhinitis: Secondary | ICD-10-CM | POA: Diagnosis not present

## 2018-08-29 DIAGNOSIS — J301 Allergic rhinitis due to pollen: Secondary | ICD-10-CM | POA: Diagnosis not present

## 2018-09-05 DIAGNOSIS — J301 Allergic rhinitis due to pollen: Secondary | ICD-10-CM | POA: Diagnosis not present

## 2018-09-05 DIAGNOSIS — J3089 Other allergic rhinitis: Secondary | ICD-10-CM | POA: Diagnosis not present

## 2018-09-05 DIAGNOSIS — J3081 Allergic rhinitis due to animal (cat) (dog) hair and dander: Secondary | ICD-10-CM | POA: Diagnosis not present

## 2018-09-12 DIAGNOSIS — J3081 Allergic rhinitis due to animal (cat) (dog) hair and dander: Secondary | ICD-10-CM | POA: Diagnosis not present

## 2018-09-12 DIAGNOSIS — J301 Allergic rhinitis due to pollen: Secondary | ICD-10-CM | POA: Diagnosis not present

## 2018-09-12 DIAGNOSIS — J3089 Other allergic rhinitis: Secondary | ICD-10-CM | POA: Diagnosis not present

## 2018-09-19 DIAGNOSIS — J3089 Other allergic rhinitis: Secondary | ICD-10-CM | POA: Diagnosis not present

## 2018-09-19 DIAGNOSIS — J3081 Allergic rhinitis due to animal (cat) (dog) hair and dander: Secondary | ICD-10-CM | POA: Diagnosis not present

## 2018-09-19 DIAGNOSIS — J301 Allergic rhinitis due to pollen: Secondary | ICD-10-CM | POA: Diagnosis not present

## 2018-09-26 DIAGNOSIS — J301 Allergic rhinitis due to pollen: Secondary | ICD-10-CM | POA: Diagnosis not present

## 2018-09-26 DIAGNOSIS — J3089 Other allergic rhinitis: Secondary | ICD-10-CM | POA: Diagnosis not present

## 2018-09-26 DIAGNOSIS — J3081 Allergic rhinitis due to animal (cat) (dog) hair and dander: Secondary | ICD-10-CM | POA: Diagnosis not present

## 2018-10-02 DIAGNOSIS — I10 Essential (primary) hypertension: Secondary | ICD-10-CM | POA: Diagnosis not present

## 2018-10-02 DIAGNOSIS — R7989 Other specified abnormal findings of blood chemistry: Secondary | ICD-10-CM | POA: Diagnosis not present

## 2018-10-02 DIAGNOSIS — R7303 Prediabetes: Secondary | ICD-10-CM | POA: Diagnosis not present

## 2018-10-02 DIAGNOSIS — E7800 Pure hypercholesterolemia, unspecified: Secondary | ICD-10-CM | POA: Diagnosis not present

## 2018-10-02 DIAGNOSIS — L308 Other specified dermatitis: Secondary | ICD-10-CM | POA: Diagnosis not present

## 2018-10-02 DIAGNOSIS — E039 Hypothyroidism, unspecified: Secondary | ICD-10-CM | POA: Diagnosis not present

## 2018-10-02 DIAGNOSIS — D485 Neoplasm of uncertain behavior of skin: Secondary | ICD-10-CM | POA: Diagnosis not present

## 2018-10-03 DIAGNOSIS — J301 Allergic rhinitis due to pollen: Secondary | ICD-10-CM | POA: Diagnosis not present

## 2018-10-03 DIAGNOSIS — J3089 Other allergic rhinitis: Secondary | ICD-10-CM | POA: Diagnosis not present

## 2018-10-03 DIAGNOSIS — J3081 Allergic rhinitis due to animal (cat) (dog) hair and dander: Secondary | ICD-10-CM | POA: Diagnosis not present

## 2018-10-10 DIAGNOSIS — J3089 Other allergic rhinitis: Secondary | ICD-10-CM | POA: Diagnosis not present

## 2018-10-10 DIAGNOSIS — J301 Allergic rhinitis due to pollen: Secondary | ICD-10-CM | POA: Diagnosis not present

## 2018-10-10 DIAGNOSIS — J3081 Allergic rhinitis due to animal (cat) (dog) hair and dander: Secondary | ICD-10-CM | POA: Diagnosis not present

## 2018-10-14 DIAGNOSIS — E7800 Pure hypercholesterolemia, unspecified: Secondary | ICD-10-CM | POA: Diagnosis not present

## 2018-10-14 DIAGNOSIS — Z0000 Encounter for general adult medical examination without abnormal findings: Secondary | ICD-10-CM | POA: Diagnosis not present

## 2018-10-14 DIAGNOSIS — I10 Essential (primary) hypertension: Secondary | ICD-10-CM | POA: Diagnosis not present

## 2018-10-14 DIAGNOSIS — R7303 Prediabetes: Secondary | ICD-10-CM | POA: Diagnosis not present

## 2018-10-14 DIAGNOSIS — G8929 Other chronic pain: Secondary | ICD-10-CM | POA: Diagnosis not present

## 2018-10-14 DIAGNOSIS — M25551 Pain in right hip: Secondary | ICD-10-CM | POA: Diagnosis not present

## 2018-10-14 DIAGNOSIS — E039 Hypothyroidism, unspecified: Secondary | ICD-10-CM | POA: Diagnosis not present

## 2018-10-23 DIAGNOSIS — J3081 Allergic rhinitis due to animal (cat) (dog) hair and dander: Secondary | ICD-10-CM | POA: Diagnosis not present

## 2018-10-23 DIAGNOSIS — J301 Allergic rhinitis due to pollen: Secondary | ICD-10-CM | POA: Diagnosis not present

## 2018-10-23 DIAGNOSIS — J3089 Other allergic rhinitis: Secondary | ICD-10-CM | POA: Diagnosis not present

## 2018-10-31 DIAGNOSIS — J301 Allergic rhinitis due to pollen: Secondary | ICD-10-CM | POA: Diagnosis not present

## 2018-10-31 DIAGNOSIS — J3089 Other allergic rhinitis: Secondary | ICD-10-CM | POA: Diagnosis not present

## 2018-11-07 DIAGNOSIS — J3089 Other allergic rhinitis: Secondary | ICD-10-CM | POA: Diagnosis not present

## 2018-11-07 DIAGNOSIS — J3081 Allergic rhinitis due to animal (cat) (dog) hair and dander: Secondary | ICD-10-CM | POA: Diagnosis not present

## 2018-11-07 DIAGNOSIS — J301 Allergic rhinitis due to pollen: Secondary | ICD-10-CM | POA: Diagnosis not present

## 2018-11-11 DIAGNOSIS — J3081 Allergic rhinitis due to animal (cat) (dog) hair and dander: Secondary | ICD-10-CM | POA: Diagnosis not present

## 2018-11-11 DIAGNOSIS — J301 Allergic rhinitis due to pollen: Secondary | ICD-10-CM | POA: Diagnosis not present

## 2018-11-11 DIAGNOSIS — J3089 Other allergic rhinitis: Secondary | ICD-10-CM | POA: Diagnosis not present

## 2018-11-19 DIAGNOSIS — L111 Transient acantholytic dermatosis [Grover]: Secondary | ICD-10-CM | POA: Diagnosis not present

## 2018-11-21 DIAGNOSIS — J3089 Other allergic rhinitis: Secondary | ICD-10-CM | POA: Diagnosis not present

## 2018-11-21 DIAGNOSIS — J3081 Allergic rhinitis due to animal (cat) (dog) hair and dander: Secondary | ICD-10-CM | POA: Diagnosis not present

## 2018-11-21 DIAGNOSIS — J301 Allergic rhinitis due to pollen: Secondary | ICD-10-CM | POA: Diagnosis not present

## 2018-11-28 DIAGNOSIS — J3089 Other allergic rhinitis: Secondary | ICD-10-CM | POA: Diagnosis not present

## 2018-11-28 DIAGNOSIS — J301 Allergic rhinitis due to pollen: Secondary | ICD-10-CM | POA: Diagnosis not present

## 2018-11-28 DIAGNOSIS — J3081 Allergic rhinitis due to animal (cat) (dog) hair and dander: Secondary | ICD-10-CM | POA: Diagnosis not present

## 2018-12-05 DIAGNOSIS — J301 Allergic rhinitis due to pollen: Secondary | ICD-10-CM | POA: Diagnosis not present

## 2018-12-05 DIAGNOSIS — J3081 Allergic rhinitis due to animal (cat) (dog) hair and dander: Secondary | ICD-10-CM | POA: Diagnosis not present

## 2018-12-05 DIAGNOSIS — J3089 Other allergic rhinitis: Secondary | ICD-10-CM | POA: Diagnosis not present

## 2018-12-12 DIAGNOSIS — J301 Allergic rhinitis due to pollen: Secondary | ICD-10-CM | POA: Diagnosis not present

## 2018-12-12 DIAGNOSIS — J3089 Other allergic rhinitis: Secondary | ICD-10-CM | POA: Diagnosis not present

## 2018-12-12 DIAGNOSIS — J3081 Allergic rhinitis due to animal (cat) (dog) hair and dander: Secondary | ICD-10-CM | POA: Diagnosis not present

## 2018-12-19 DIAGNOSIS — J301 Allergic rhinitis due to pollen: Secondary | ICD-10-CM | POA: Diagnosis not present

## 2018-12-19 DIAGNOSIS — J3089 Other allergic rhinitis: Secondary | ICD-10-CM | POA: Diagnosis not present

## 2018-12-19 DIAGNOSIS — J3081 Allergic rhinitis due to animal (cat) (dog) hair and dander: Secondary | ICD-10-CM | POA: Diagnosis not present

## 2018-12-23 DIAGNOSIS — J301 Allergic rhinitis due to pollen: Secondary | ICD-10-CM | POA: Diagnosis not present

## 2018-12-23 DIAGNOSIS — J3081 Allergic rhinitis due to animal (cat) (dog) hair and dander: Secondary | ICD-10-CM | POA: Diagnosis not present

## 2018-12-30 DIAGNOSIS — J3089 Other allergic rhinitis: Secondary | ICD-10-CM | POA: Diagnosis not present

## 2018-12-30 DIAGNOSIS — J3081 Allergic rhinitis due to animal (cat) (dog) hair and dander: Secondary | ICD-10-CM | POA: Diagnosis not present

## 2018-12-30 DIAGNOSIS — J301 Allergic rhinitis due to pollen: Secondary | ICD-10-CM | POA: Diagnosis not present

## 2019-01-09 DIAGNOSIS — J301 Allergic rhinitis due to pollen: Secondary | ICD-10-CM | POA: Diagnosis not present

## 2019-01-09 DIAGNOSIS — J3089 Other allergic rhinitis: Secondary | ICD-10-CM | POA: Diagnosis not present

## 2019-01-09 DIAGNOSIS — J3081 Allergic rhinitis due to animal (cat) (dog) hair and dander: Secondary | ICD-10-CM | POA: Diagnosis not present

## 2019-01-16 DIAGNOSIS — J3089 Other allergic rhinitis: Secondary | ICD-10-CM | POA: Diagnosis not present

## 2019-01-16 DIAGNOSIS — J301 Allergic rhinitis due to pollen: Secondary | ICD-10-CM | POA: Diagnosis not present

## 2019-01-16 DIAGNOSIS — J3081 Allergic rhinitis due to animal (cat) (dog) hair and dander: Secondary | ICD-10-CM | POA: Diagnosis not present

## 2019-01-23 DIAGNOSIS — J3089 Other allergic rhinitis: Secondary | ICD-10-CM | POA: Diagnosis not present

## 2019-01-23 DIAGNOSIS — J301 Allergic rhinitis due to pollen: Secondary | ICD-10-CM | POA: Diagnosis not present

## 2019-01-23 DIAGNOSIS — J3081 Allergic rhinitis due to animal (cat) (dog) hair and dander: Secondary | ICD-10-CM | POA: Diagnosis not present

## 2019-01-27 DIAGNOSIS — J301 Allergic rhinitis due to pollen: Secondary | ICD-10-CM | POA: Diagnosis not present

## 2019-01-27 DIAGNOSIS — J3089 Other allergic rhinitis: Secondary | ICD-10-CM | POA: Diagnosis not present

## 2019-01-27 DIAGNOSIS — J4520 Mild intermittent asthma, uncomplicated: Secondary | ICD-10-CM | POA: Diagnosis not present

## 2019-01-27 DIAGNOSIS — J3081 Allergic rhinitis due to animal (cat) (dog) hair and dander: Secondary | ICD-10-CM | POA: Diagnosis not present

## 2019-01-30 DIAGNOSIS — J3089 Other allergic rhinitis: Secondary | ICD-10-CM | POA: Diagnosis not present

## 2019-01-30 DIAGNOSIS — J3081 Allergic rhinitis due to animal (cat) (dog) hair and dander: Secondary | ICD-10-CM | POA: Diagnosis not present

## 2019-01-30 DIAGNOSIS — J301 Allergic rhinitis due to pollen: Secondary | ICD-10-CM | POA: Diagnosis not present

## 2019-02-03 DIAGNOSIS — J301 Allergic rhinitis due to pollen: Secondary | ICD-10-CM | POA: Diagnosis not present

## 2019-02-03 DIAGNOSIS — J3089 Other allergic rhinitis: Secondary | ICD-10-CM | POA: Diagnosis not present

## 2019-02-03 DIAGNOSIS — J3081 Allergic rhinitis due to animal (cat) (dog) hair and dander: Secondary | ICD-10-CM | POA: Diagnosis not present

## 2019-02-06 DIAGNOSIS — J3089 Other allergic rhinitis: Secondary | ICD-10-CM | POA: Diagnosis not present

## 2019-02-06 DIAGNOSIS — J3081 Allergic rhinitis due to animal (cat) (dog) hair and dander: Secondary | ICD-10-CM | POA: Diagnosis not present

## 2019-02-06 DIAGNOSIS — J301 Allergic rhinitis due to pollen: Secondary | ICD-10-CM | POA: Diagnosis not present

## 2019-02-13 DIAGNOSIS — J301 Allergic rhinitis due to pollen: Secondary | ICD-10-CM | POA: Diagnosis not present

## 2019-02-13 DIAGNOSIS — J3089 Other allergic rhinitis: Secondary | ICD-10-CM | POA: Diagnosis not present

## 2019-02-13 DIAGNOSIS — J3081 Allergic rhinitis due to animal (cat) (dog) hair and dander: Secondary | ICD-10-CM | POA: Diagnosis not present

## 2019-02-20 DIAGNOSIS — J301 Allergic rhinitis due to pollen: Secondary | ICD-10-CM | POA: Diagnosis not present

## 2019-02-20 DIAGNOSIS — J3089 Other allergic rhinitis: Secondary | ICD-10-CM | POA: Diagnosis not present

## 2019-02-20 DIAGNOSIS — J3081 Allergic rhinitis due to animal (cat) (dog) hair and dander: Secondary | ICD-10-CM | POA: Diagnosis not present

## 2019-02-27 DIAGNOSIS — J301 Allergic rhinitis due to pollen: Secondary | ICD-10-CM | POA: Diagnosis not present

## 2019-02-27 DIAGNOSIS — J3081 Allergic rhinitis due to animal (cat) (dog) hair and dander: Secondary | ICD-10-CM | POA: Diagnosis not present

## 2019-02-27 DIAGNOSIS — J3089 Other allergic rhinitis: Secondary | ICD-10-CM | POA: Diagnosis not present

## 2019-03-06 DIAGNOSIS — J3081 Allergic rhinitis due to animal (cat) (dog) hair and dander: Secondary | ICD-10-CM | POA: Diagnosis not present

## 2019-03-06 DIAGNOSIS — J301 Allergic rhinitis due to pollen: Secondary | ICD-10-CM | POA: Diagnosis not present

## 2019-03-06 DIAGNOSIS — J3089 Other allergic rhinitis: Secondary | ICD-10-CM | POA: Diagnosis not present

## 2019-03-10 DIAGNOSIS — H40003 Preglaucoma, unspecified, bilateral: Secondary | ICD-10-CM | POA: Diagnosis not present

## 2019-03-10 DIAGNOSIS — H18513 Endothelial corneal dystrophy, bilateral: Secondary | ICD-10-CM | POA: Diagnosis not present

## 2019-03-13 DIAGNOSIS — J3089 Other allergic rhinitis: Secondary | ICD-10-CM | POA: Diagnosis not present

## 2019-03-13 DIAGNOSIS — J3081 Allergic rhinitis due to animal (cat) (dog) hair and dander: Secondary | ICD-10-CM | POA: Diagnosis not present

## 2019-03-13 DIAGNOSIS — J301 Allergic rhinitis due to pollen: Secondary | ICD-10-CM | POA: Diagnosis not present

## 2019-03-20 DIAGNOSIS — J3081 Allergic rhinitis due to animal (cat) (dog) hair and dander: Secondary | ICD-10-CM | POA: Diagnosis not present

## 2019-03-20 DIAGNOSIS — J301 Allergic rhinitis due to pollen: Secondary | ICD-10-CM | POA: Diagnosis not present

## 2019-03-20 DIAGNOSIS — J3089 Other allergic rhinitis: Secondary | ICD-10-CM | POA: Diagnosis not present

## 2019-03-27 DIAGNOSIS — J3081 Allergic rhinitis due to animal (cat) (dog) hair and dander: Secondary | ICD-10-CM | POA: Diagnosis not present

## 2019-03-27 DIAGNOSIS — J301 Allergic rhinitis due to pollen: Secondary | ICD-10-CM | POA: Diagnosis not present

## 2019-03-27 DIAGNOSIS — J3089 Other allergic rhinitis: Secondary | ICD-10-CM | POA: Diagnosis not present

## 2019-04-03 DIAGNOSIS — J3089 Other allergic rhinitis: Secondary | ICD-10-CM | POA: Diagnosis not present

## 2019-04-03 DIAGNOSIS — J301 Allergic rhinitis due to pollen: Secondary | ICD-10-CM | POA: Diagnosis not present

## 2019-04-03 DIAGNOSIS — J3081 Allergic rhinitis due to animal (cat) (dog) hair and dander: Secondary | ICD-10-CM | POA: Diagnosis not present

## 2019-04-08 DIAGNOSIS — E039 Hypothyroidism, unspecified: Secondary | ICD-10-CM | POA: Diagnosis not present

## 2019-04-08 DIAGNOSIS — R7303 Prediabetes: Secondary | ICD-10-CM | POA: Diagnosis not present

## 2019-04-08 DIAGNOSIS — E7800 Pure hypercholesterolemia, unspecified: Secondary | ICD-10-CM | POA: Diagnosis not present

## 2019-04-08 DIAGNOSIS — I10 Essential (primary) hypertension: Secondary | ICD-10-CM | POA: Diagnosis not present

## 2019-04-15 DIAGNOSIS — E039 Hypothyroidism, unspecified: Secondary | ICD-10-CM | POA: Diagnosis not present

## 2019-04-15 DIAGNOSIS — Z1211 Encounter for screening for malignant neoplasm of colon: Secondary | ICD-10-CM | POA: Diagnosis not present

## 2019-04-15 DIAGNOSIS — R7303 Prediabetes: Secondary | ICD-10-CM | POA: Diagnosis not present

## 2019-04-15 DIAGNOSIS — E7800 Pure hypercholesterolemia, unspecified: Secondary | ICD-10-CM | POA: Diagnosis not present

## 2019-04-15 DIAGNOSIS — I10 Essential (primary) hypertension: Secondary | ICD-10-CM | POA: Diagnosis not present

## 2019-04-17 DIAGNOSIS — J3089 Other allergic rhinitis: Secondary | ICD-10-CM | POA: Diagnosis not present

## 2019-04-17 DIAGNOSIS — J3081 Allergic rhinitis due to animal (cat) (dog) hair and dander: Secondary | ICD-10-CM | POA: Diagnosis not present

## 2019-04-17 DIAGNOSIS — J301 Allergic rhinitis due to pollen: Secondary | ICD-10-CM | POA: Diagnosis not present

## 2019-04-24 DIAGNOSIS — J301 Allergic rhinitis due to pollen: Secondary | ICD-10-CM | POA: Diagnosis not present

## 2019-04-24 DIAGNOSIS — J3089 Other allergic rhinitis: Secondary | ICD-10-CM | POA: Diagnosis not present

## 2019-04-24 DIAGNOSIS — J3081 Allergic rhinitis due to animal (cat) (dog) hair and dander: Secondary | ICD-10-CM | POA: Diagnosis not present

## 2019-05-01 DIAGNOSIS — J3081 Allergic rhinitis due to animal (cat) (dog) hair and dander: Secondary | ICD-10-CM | POA: Diagnosis not present

## 2019-05-01 DIAGNOSIS — J301 Allergic rhinitis due to pollen: Secondary | ICD-10-CM | POA: Diagnosis not present

## 2019-05-01 DIAGNOSIS — J3089 Other allergic rhinitis: Secondary | ICD-10-CM | POA: Diagnosis not present

## 2019-05-05 DIAGNOSIS — J3089 Other allergic rhinitis: Secondary | ICD-10-CM | POA: Diagnosis not present

## 2019-05-05 DIAGNOSIS — J301 Allergic rhinitis due to pollen: Secondary | ICD-10-CM | POA: Diagnosis not present

## 2019-05-05 DIAGNOSIS — J3081 Allergic rhinitis due to animal (cat) (dog) hair and dander: Secondary | ICD-10-CM | POA: Diagnosis not present

## 2019-05-14 DIAGNOSIS — J301 Allergic rhinitis due to pollen: Secondary | ICD-10-CM | POA: Diagnosis not present

## 2019-05-14 DIAGNOSIS — J3089 Other allergic rhinitis: Secondary | ICD-10-CM | POA: Diagnosis not present

## 2019-05-14 DIAGNOSIS — J3081 Allergic rhinitis due to animal (cat) (dog) hair and dander: Secondary | ICD-10-CM | POA: Diagnosis not present

## 2019-05-22 ENCOUNTER — Other Ambulatory Visit: Payer: Self-pay | Admitting: Family Medicine

## 2019-05-22 DIAGNOSIS — J301 Allergic rhinitis due to pollen: Secondary | ICD-10-CM | POA: Diagnosis not present

## 2019-05-22 DIAGNOSIS — J3089 Other allergic rhinitis: Secondary | ICD-10-CM | POA: Diagnosis not present

## 2019-05-22 DIAGNOSIS — J3081 Allergic rhinitis due to animal (cat) (dog) hair and dander: Secondary | ICD-10-CM | POA: Diagnosis not present

## 2019-05-22 DIAGNOSIS — Z1231 Encounter for screening mammogram for malignant neoplasm of breast: Secondary | ICD-10-CM

## 2019-05-27 DIAGNOSIS — L309 Dermatitis, unspecified: Secondary | ICD-10-CM | POA: Diagnosis not present

## 2019-05-29 DIAGNOSIS — J3081 Allergic rhinitis due to animal (cat) (dog) hair and dander: Secondary | ICD-10-CM | POA: Diagnosis not present

## 2019-05-29 DIAGNOSIS — J301 Allergic rhinitis due to pollen: Secondary | ICD-10-CM | POA: Diagnosis not present

## 2019-05-29 DIAGNOSIS — J3089 Other allergic rhinitis: Secondary | ICD-10-CM | POA: Diagnosis not present

## 2019-06-05 DIAGNOSIS — J301 Allergic rhinitis due to pollen: Secondary | ICD-10-CM | POA: Diagnosis not present

## 2019-06-05 DIAGNOSIS — J3089 Other allergic rhinitis: Secondary | ICD-10-CM | POA: Diagnosis not present

## 2019-06-05 DIAGNOSIS — J3081 Allergic rhinitis due to animal (cat) (dog) hair and dander: Secondary | ICD-10-CM | POA: Diagnosis not present

## 2019-06-10 DIAGNOSIS — Z1211 Encounter for screening for malignant neoplasm of colon: Secondary | ICD-10-CM | POA: Diagnosis not present

## 2019-06-10 DIAGNOSIS — K5900 Constipation, unspecified: Secondary | ICD-10-CM | POA: Diagnosis not present

## 2019-06-12 DIAGNOSIS — J301 Allergic rhinitis due to pollen: Secondary | ICD-10-CM | POA: Diagnosis not present

## 2019-06-12 DIAGNOSIS — J3089 Other allergic rhinitis: Secondary | ICD-10-CM | POA: Diagnosis not present

## 2019-06-12 DIAGNOSIS — J3081 Allergic rhinitis due to animal (cat) (dog) hair and dander: Secondary | ICD-10-CM | POA: Diagnosis not present

## 2019-06-16 DIAGNOSIS — E039 Hypothyroidism, unspecified: Secondary | ICD-10-CM | POA: Diagnosis not present

## 2019-06-19 DIAGNOSIS — J3089 Other allergic rhinitis: Secondary | ICD-10-CM | POA: Diagnosis not present

## 2019-06-19 DIAGNOSIS — J301 Allergic rhinitis due to pollen: Secondary | ICD-10-CM | POA: Diagnosis not present

## 2019-06-19 DIAGNOSIS — J3081 Allergic rhinitis due to animal (cat) (dog) hair and dander: Secondary | ICD-10-CM | POA: Diagnosis not present

## 2019-06-26 DIAGNOSIS — J301 Allergic rhinitis due to pollen: Secondary | ICD-10-CM | POA: Diagnosis not present

## 2019-06-26 DIAGNOSIS — J3081 Allergic rhinitis due to animal (cat) (dog) hair and dander: Secondary | ICD-10-CM | POA: Diagnosis not present

## 2019-06-26 DIAGNOSIS — J3089 Other allergic rhinitis: Secondary | ICD-10-CM | POA: Diagnosis not present

## 2019-06-30 DIAGNOSIS — J3089 Other allergic rhinitis: Secondary | ICD-10-CM | POA: Diagnosis not present

## 2019-06-30 DIAGNOSIS — J301 Allergic rhinitis due to pollen: Secondary | ICD-10-CM | POA: Diagnosis not present

## 2019-06-30 DIAGNOSIS — J3081 Allergic rhinitis due to animal (cat) (dog) hair and dander: Secondary | ICD-10-CM | POA: Diagnosis not present

## 2019-07-03 DIAGNOSIS — J3089 Other allergic rhinitis: Secondary | ICD-10-CM | POA: Diagnosis not present

## 2019-07-03 DIAGNOSIS — J301 Allergic rhinitis due to pollen: Secondary | ICD-10-CM | POA: Diagnosis not present

## 2019-07-03 DIAGNOSIS — J3081 Allergic rhinitis due to animal (cat) (dog) hair and dander: Secondary | ICD-10-CM | POA: Diagnosis not present

## 2019-07-10 DIAGNOSIS — J301 Allergic rhinitis due to pollen: Secondary | ICD-10-CM | POA: Diagnosis not present

## 2019-07-10 DIAGNOSIS — J3089 Other allergic rhinitis: Secondary | ICD-10-CM | POA: Diagnosis not present

## 2019-07-10 DIAGNOSIS — J3081 Allergic rhinitis due to animal (cat) (dog) hair and dander: Secondary | ICD-10-CM | POA: Diagnosis not present

## 2019-07-17 DIAGNOSIS — J3089 Other allergic rhinitis: Secondary | ICD-10-CM | POA: Diagnosis not present

## 2019-07-17 DIAGNOSIS — J301 Allergic rhinitis due to pollen: Secondary | ICD-10-CM | POA: Diagnosis not present

## 2019-07-17 DIAGNOSIS — J3081 Allergic rhinitis due to animal (cat) (dog) hair and dander: Secondary | ICD-10-CM | POA: Diagnosis not present

## 2019-07-24 DIAGNOSIS — J3089 Other allergic rhinitis: Secondary | ICD-10-CM | POA: Diagnosis not present

## 2019-07-24 DIAGNOSIS — J3081 Allergic rhinitis due to animal (cat) (dog) hair and dander: Secondary | ICD-10-CM | POA: Diagnosis not present

## 2019-07-24 DIAGNOSIS — J301 Allergic rhinitis due to pollen: Secondary | ICD-10-CM | POA: Diagnosis not present

## 2019-07-31 DIAGNOSIS — J3081 Allergic rhinitis due to animal (cat) (dog) hair and dander: Secondary | ICD-10-CM | POA: Diagnosis not present

## 2019-07-31 DIAGNOSIS — J301 Allergic rhinitis due to pollen: Secondary | ICD-10-CM | POA: Diagnosis not present

## 2019-07-31 DIAGNOSIS — J3089 Other allergic rhinitis: Secondary | ICD-10-CM | POA: Diagnosis not present

## 2019-08-04 DIAGNOSIS — R198 Other specified symptoms and signs involving the digestive system and abdomen: Secondary | ICD-10-CM | POA: Diagnosis not present

## 2019-08-05 DIAGNOSIS — R197 Diarrhea, unspecified: Secondary | ICD-10-CM | POA: Diagnosis not present

## 2019-08-05 DIAGNOSIS — K5900 Constipation, unspecified: Secondary | ICD-10-CM | POA: Diagnosis not present

## 2019-08-07 DIAGNOSIS — J301 Allergic rhinitis due to pollen: Secondary | ICD-10-CM | POA: Diagnosis not present

## 2019-08-07 DIAGNOSIS — J3081 Allergic rhinitis due to animal (cat) (dog) hair and dander: Secondary | ICD-10-CM | POA: Diagnosis not present

## 2019-08-07 DIAGNOSIS — J3089 Other allergic rhinitis: Secondary | ICD-10-CM | POA: Diagnosis not present

## 2019-08-18 DIAGNOSIS — J301 Allergic rhinitis due to pollen: Secondary | ICD-10-CM | POA: Diagnosis not present

## 2019-08-18 DIAGNOSIS — J3081 Allergic rhinitis due to animal (cat) (dog) hair and dander: Secondary | ICD-10-CM | POA: Diagnosis not present

## 2019-08-18 DIAGNOSIS — J3089 Other allergic rhinitis: Secondary | ICD-10-CM | POA: Diagnosis not present

## 2019-08-27 ENCOUNTER — Ambulatory Visit
Admission: RE | Admit: 2019-08-27 | Discharge: 2019-08-27 | Disposition: A | Discharge status: Home / Self Care | Payer: PPO | Source: Ambulatory Visit | Attending: Family Medicine | Admitting: Family Medicine

## 2019-08-27 DIAGNOSIS — Z1231 Encounter for screening mammogram for malignant neoplasm of breast: Secondary | ICD-10-CM | POA: Diagnosis not present

## 2019-08-28 DIAGNOSIS — J3081 Allergic rhinitis due to animal (cat) (dog) hair and dander: Secondary | ICD-10-CM | POA: Diagnosis not present

## 2019-08-28 DIAGNOSIS — J3089 Other allergic rhinitis: Secondary | ICD-10-CM | POA: Diagnosis not present

## 2019-08-28 DIAGNOSIS — J301 Allergic rhinitis due to pollen: Secondary | ICD-10-CM | POA: Diagnosis not present

## 2019-09-04 DIAGNOSIS — J301 Allergic rhinitis due to pollen: Secondary | ICD-10-CM | POA: Diagnosis not present

## 2019-09-04 DIAGNOSIS — J3089 Other allergic rhinitis: Secondary | ICD-10-CM | POA: Diagnosis not present

## 2019-09-04 DIAGNOSIS — J3081 Allergic rhinitis due to animal (cat) (dog) hair and dander: Secondary | ICD-10-CM | POA: Diagnosis not present

## 2019-09-09 DIAGNOSIS — H40003 Preglaucoma, unspecified, bilateral: Secondary | ICD-10-CM | POA: Diagnosis not present

## 2019-09-11 DIAGNOSIS — J301 Allergic rhinitis due to pollen: Secondary | ICD-10-CM | POA: Diagnosis not present

## 2019-09-11 DIAGNOSIS — J3089 Other allergic rhinitis: Secondary | ICD-10-CM | POA: Diagnosis not present

## 2019-09-11 DIAGNOSIS — J3081 Allergic rhinitis due to animal (cat) (dog) hair and dander: Secondary | ICD-10-CM | POA: Diagnosis not present

## 2019-09-15 DIAGNOSIS — H40003 Preglaucoma, unspecified, bilateral: Secondary | ICD-10-CM | POA: Diagnosis not present

## 2019-09-18 DIAGNOSIS — J301 Allergic rhinitis due to pollen: Secondary | ICD-10-CM | POA: Diagnosis not present

## 2019-09-18 DIAGNOSIS — J3089 Other allergic rhinitis: Secondary | ICD-10-CM | POA: Diagnosis not present

## 2019-09-18 DIAGNOSIS — J3081 Allergic rhinitis due to animal (cat) (dog) hair and dander: Secondary | ICD-10-CM | POA: Diagnosis not present

## 2019-09-25 DIAGNOSIS — J3081 Allergic rhinitis due to animal (cat) (dog) hair and dander: Secondary | ICD-10-CM | POA: Diagnosis not present

## 2019-09-25 DIAGNOSIS — J3089 Other allergic rhinitis: Secondary | ICD-10-CM | POA: Diagnosis not present

## 2019-09-25 DIAGNOSIS — J301 Allergic rhinitis due to pollen: Secondary | ICD-10-CM | POA: Diagnosis not present

## 2019-10-01 ENCOUNTER — Other Ambulatory Visit: Payer: Self-pay

## 2019-10-01 ENCOUNTER — Other Ambulatory Visit
Admission: RE | Admit: 2019-10-01 | Discharge: 2019-10-01 | Disposition: A | Discharge status: Home / Self Care | Payer: PPO | Source: Ambulatory Visit | Attending: Internal Medicine | Admitting: Internal Medicine

## 2019-10-01 DIAGNOSIS — Z01812 Encounter for preprocedural laboratory examination: Secondary | ICD-10-CM | POA: Diagnosis not present

## 2019-10-01 DIAGNOSIS — Z20822 Contact with and (suspected) exposure to covid-19: Secondary | ICD-10-CM | POA: Insufficient documentation

## 2019-10-02 ENCOUNTER — Encounter: Payer: Self-pay | Admitting: Internal Medicine

## 2019-10-02 DIAGNOSIS — J3081 Allergic rhinitis due to animal (cat) (dog) hair and dander: Secondary | ICD-10-CM | POA: Diagnosis not present

## 2019-10-02 DIAGNOSIS — J3089 Other allergic rhinitis: Secondary | ICD-10-CM | POA: Diagnosis not present

## 2019-10-02 DIAGNOSIS — J301 Allergic rhinitis due to pollen: Secondary | ICD-10-CM | POA: Diagnosis not present

## 2019-10-02 LAB — SARS CORONAVIRUS 2 (TAT 6-24 HRS): SARS Coronavirus 2: NEGATIVE

## 2019-10-05 ENCOUNTER — Ambulatory Visit: Payer: PPO | Admitting: Anesthesiology

## 2019-10-05 ENCOUNTER — Other Ambulatory Visit: Payer: Self-pay

## 2019-10-05 ENCOUNTER — Encounter: Payer: Self-pay | Admitting: Internal Medicine

## 2019-10-05 ENCOUNTER — Encounter
Admission: RE | Disposition: A | Discharge status: Home / Self Care | Payer: Self-pay | Source: Home / Self Care | Attending: Internal Medicine

## 2019-10-05 ENCOUNTER — Ambulatory Visit
Admission: RE | Admit: 2019-10-05 | Discharge: 2019-10-05 | Disposition: A | Discharge status: Home / Self Care | Payer: PPO | Attending: Internal Medicine | Admitting: Internal Medicine

## 2019-10-05 DIAGNOSIS — M1990 Unspecified osteoarthritis, unspecified site: Secondary | ICD-10-CM | POA: Diagnosis not present

## 2019-10-05 DIAGNOSIS — E7800 Pure hypercholesterolemia, unspecified: Secondary | ICD-10-CM | POA: Diagnosis not present

## 2019-10-05 DIAGNOSIS — K640 First degree hemorrhoids: Secondary | ICD-10-CM | POA: Insufficient documentation

## 2019-10-05 DIAGNOSIS — J45909 Unspecified asthma, uncomplicated: Secondary | ICD-10-CM | POA: Diagnosis not present

## 2019-10-05 DIAGNOSIS — E039 Hypothyroidism, unspecified: Secondary | ICD-10-CM | POA: Insufficient documentation

## 2019-10-05 DIAGNOSIS — Z79899 Other long term (current) drug therapy: Secondary | ICD-10-CM | POA: Insufficient documentation

## 2019-10-05 DIAGNOSIS — Z79890 Hormone replacement therapy (postmenopausal): Secondary | ICD-10-CM | POA: Diagnosis not present

## 2019-10-05 DIAGNOSIS — K5790 Diverticulosis of intestine, part unspecified, without perforation or abscess without bleeding: Secondary | ICD-10-CM | POA: Diagnosis not present

## 2019-10-05 DIAGNOSIS — Z1211 Encounter for screening for malignant neoplasm of colon: Secondary | ICD-10-CM | POA: Diagnosis not present

## 2019-10-05 DIAGNOSIS — K5730 Diverticulosis of large intestine without perforation or abscess without bleeding: Secondary | ICD-10-CM | POA: Diagnosis not present

## 2019-10-05 DIAGNOSIS — I10 Essential (primary) hypertension: Secondary | ICD-10-CM | POA: Diagnosis not present

## 2019-10-05 DIAGNOSIS — K648 Other hemorrhoids: Secondary | ICD-10-CM | POA: Diagnosis not present

## 2019-10-05 HISTORY — DX: Essential (primary) hypertension: I10

## 2019-10-05 HISTORY — DX: Personal history of other infectious and parasitic diseases: Z86.19

## 2019-10-05 HISTORY — DX: Endothelial corneal dystrophy, unspecified eye: H18.519

## 2019-10-05 HISTORY — DX: Pure hypercholesterolemia, unspecified: E78.00

## 2019-10-05 HISTORY — DX: Unspecified asthma, uncomplicated: J45.909

## 2019-10-05 HISTORY — DX: Unspecified osteoarthritis, unspecified site: M19.90

## 2019-10-05 HISTORY — DX: Hypothyroidism, unspecified: E03.9

## 2019-10-05 HISTORY — PX: COLONOSCOPY WITH PROPOFOL: SHX5780

## 2019-10-05 SURGERY — COLONOSCOPY WITH PROPOFOL
Anesthesia: General

## 2019-10-05 MED ORDER — ONDANSETRON HCL 4 MG/2ML IJ SOLN
INTRAMUSCULAR | Status: DC | PRN
  Administered 2019-10-05: 4 mg via INTRAVENOUS

## 2019-10-05 MED ORDER — LIDOCAINE HCL (PF) 2 % IJ SOLN
INTRAMUSCULAR | Status: DC | PRN
  Administered 2019-10-05: 40 mg via INTRADERMAL

## 2019-10-05 MED ORDER — SODIUM CHLORIDE 0.9 % IV SOLN
INTRAVENOUS | Status: DC
  Administered 2019-10-05: 1000 mL via INTRAVENOUS

## 2019-10-05 MED ORDER — PROPOFOL 500 MG/50ML IV EMUL
INTRAVENOUS | Status: AC
  Filled 2019-10-05: qty 50

## 2019-10-05 MED ORDER — PROPOFOL 10 MG/ML IV BOLUS
INTRAVENOUS | Status: DC | PRN
  Administered 2019-10-05: 150 ug/kg/min via INTRAVENOUS

## 2019-10-05 NOTE — Op Note (Signed)
[facility]astroenterology Patient Name: Terri Jefferson Procedure Date: 10/05/2019 8:39 AM MRN: [id number] Account #: [id number] Date of Birth: 12/24/1950 Admit Type: Outpatient Age: 69 Room: [facility] ENDO ROOM 3 Gender: Female Note Status: Finalized Procedure:             Colonoscopy Indications:           Screening for colorectal malignant neoplasm Providers:             Benay Pike. [name] MD, MD Referring MD:          Dion Body (Referring MD) Medicines:             Propofol per Anesthesia Complications:         No immediate complications. Procedure:             Pre-Anesthesia Assessment:                        - The risks and benefits of the procedure and the                         sedation options and risks were discussed with the                         patient. All questions were answered and informed                         consent was obtained.                        - Patient identification and proposed procedure were                         verified prior to the procedure by the nurse. The                         procedure was verified in the procedure room.                        - ASA Grade Assessment: III - A patient with severe                         systemic disease.                        - After reviewing the risks and benefits, the patient                         was deemed in satisfactory condition to undergo the                         procedure.                        After obtaining informed consent, the colonoscope was                         passed under direct vision. Throughout the procedure,                         the patient's blood pressure, pulse, and  oxygen                         saturations were monitored continuously. The                         Colonoscope was introduced through the anus and                         advanced to the the cecum, identified by appendiceal                         orifice and ileocecal valve. The  colonoscopy was                         performed without difficulty. The patient tolerated                         the procedure well. The quality of the bowel                         preparation was excellent. The ileocecal valve,                         appendiceal orifice, and rectum were photographed. Findings:      The perianal and digital rectal examinations were normal. Pertinent       negatives include normal sphincter tone and no palpable rectal lesions.      Multiple small and large-mouthed diverticula were found in the sigmoid       colon.      Non-bleeding internal hemorrhoids were found during retroflexion. The       hemorrhoids were Grade I (internal hemorrhoids that do not prolapse).      The exam was otherwise without abnormality. Impression:            - Diverticulosis in the sigmoid colon.                        - Non-bleeding internal hemorrhoids.                        - The examination was otherwise normal.                        - No specimens collected. Recommendation:        - Patient has a contact number available for                         emergencies. The signs and symptoms of potential                         delayed complications were discussed with the patient.                         Return to normal activities tomorrow. Written                         discharge instructions were provided to the patient.                        -  Resume previous diet.                        - Continue present medications.                        - Repeat colonoscopy in 10 years for screening                         purposes.                        - Return to GI office PRN.                        - The findings and recommendations were discussed with                         the patient. Procedure Code(s):     --- Professional ---                        [id number], Colorectal cancer screening; colonoscopy on                         individual not meeting criteria for high  risk Diagnosis Code(s):     --- Professional ---                        K57.30, Diverticulosis of large intestine without                         perforation or abscess without bleeding                        K64.0, First degree hemorrhoids                        Z12.11, Encounter for screening for malignant neoplasm                         of colon CPT copyright 2019 American Medical Association. All rights reserved. The codes documented in this report are preliminary and upon coder review may  be revised to meet current compliance requirements. Efrain Sella MD, MD 10/05/2019 9:17:04 AM This report has been signed electronically. Number of Addenda: 0 Note Initiated On: 10/05/2019 8:39 AM Scope Withdrawal Time: 0 hours 7 minutes 12 seconds  Total Procedure Duration: 0 hours 11 minutes 43 seconds  Estimated Blood Loss:  Estimated blood loss: none.      [facility]

## 2019-10-05 NOTE — Anesthesia Postprocedure Evaluation (Signed)
Anesthesia Post Note  Patient: Terri Jefferson  Procedure(s) Performed: COLONOSCOPY WITH PROPOFOL (N/A )  Patient location during evaluation: Endoscopy Anesthesia Type: General Level of consciousness: awake and alert and oriented Pain management: pain level controlled Vital Signs Assessment: post-procedure vital signs reviewed and stable Respiratory status: spontaneous breathing, nonlabored ventilation and respiratory function stable Cardiovascular status: blood pressure returned to baseline and stable Postop Assessment: no signs of nausea or vomiting Anesthetic complications: no     Last Vitals:  Vitals:   10/05/19 0937 10/05/19 0947  BP: 118/70 118/66  Pulse:    Resp:    Temp:    SpO2:      Last Pain:  Vitals:   10/05/19 0947  TempSrc:   PainSc: 0-No pain                 Pedro Whiters

## 2019-10-05 NOTE — Interval H&P Note (Signed)
History and Physical Interval Note:  10/05/2019 8:44 AM  Terri Jefferson  has presented today for surgery, with the diagnosis of SCREEN.  The various methods of treatment have been discussed with the patient and family. After consideration of risks, benefits and other options for treatment, the patient has consented to  Procedure(s): COLONOSCOPY WITH PROPOFOL (N/A) as a surgical intervention.  The patient's history has been reviewed, patient examined, no change in status, stable for surgery.  I have reviewed the patient's chart and labs.  Questions were answered to the patient's satisfaction.     Grenelefe, [place]

## 2019-10-05 NOTE — H&P (Signed)
Outpatient short stay form Pre-procedure 10/05/2019 8:43 AM Terri Jefferson Terri Jefferson, M.D.  Primary Physician: Terri Jefferson, M.D.  Reason for visit:  Colon cancer screening  History of present illness:  Patient presents for colonoscopy for colon cancer screening. The patient denies complaints of abdominal pain, significant change in bowel habits, or rectal bleeding.  Last colonoscopy in Oct 2010 was reportedly normal.     Current Facility-Administered Medications:  .  0.9 %  sodium chloride infusion, , Intravenous, Continuous, Terri Jefferson, Terri Pike, MD  Medications Prior to Admission  Medication Sig Dispense Refill Last Dose  . atorvastatin (LIPITOR) 10 MG tablet Take 10 mg by mouth daily.   10/05/2019 at Unknown time  . Calcium Carbonate-Vitamin D (CALTRATE 600+D PO) Take by mouth daily.     Marland Kitchen EPINEPHrine 0.3 mg/0.3 mL IJ SOAJ injection Inject 0.3 mg into the muscle as needed for anaphylaxis.     Marland Kitchen levothyroxine (SYNTHROID) 75 MCG tablet Take 75 mcg by mouth daily before breakfast. One tablet Monday through Friday     . levothyroxine (SYNTHROID, LEVOTHROID) 50 MCG tablet Take 50 mcg by mouth daily before breakfast. One tablet Saturday and Sunday   10/05/2019 at Unknown time  . lisinopril-hydrochlorothiazide (PRINZIDE,ZESTORETIC) 10-12.5 MG tablet Take 1 tablet by mouth daily.   10/05/2019 at Unknown time  . metoprolol succinate (TOPROL-XL) 25 MG 24 hr tablet Take 25 mg by mouth daily.   10/05/2019 at Unknown time  . Naltrexone HCl (REVIA PO) Take 3 mg by mouth daily.     Marland Kitchen triamcinolone cream (KENALOG) 0.1 % Apply 1 application topically 2 (two) times daily.     Marland Kitchen ibuprofen (ADVIL,MOTRIN) 200 MG tablet Take 200-400 mg by mouth every 6 (six) hours as needed for headache or moderate pain.     Marland Kitchen loratadine (ALAVERT) 10 MG tablet Take 10 mg by mouth daily.     . Multiple Vitamins-Minerals (MULTIVITAMIN PO) Take 1 tablet by mouth daily.        No Known Allergies   Past Medical History:   Diagnosis Date  . Arthritis   . Asthma   . Fuchs' corneal dystrophy   . History of shingles   . Hypercholesterolemia   . Hypertension   . Hypothyroidism     Review of systems:  Otherwise negative.    Physical Exam  Gen: Alert, oriented. Appears stated age.  HEENT: [place]/AT. PERRLA. Lungs: CTA, no wheezes. CV: RR nl S1, S2. Abd: soft, benign, no masses. BS+ Ext: No edema. Pulses 2+    Planned procedures: Proceed with colonoscopy. The patient understands the nature of the planned procedure, indications, risks, alternatives and potential complications including but not limited to bleeding, infection, perforation, damage to internal organs and possible oversedation/side effects from anesthesia. The patient agrees and gives consent to proceed.  Please refer to procedure notes for findings, recommendations and patient disposition/instructions.     Terri Jefferson Terri Jefferson, M.D. Gastroenterology 10/05/2019  8:43 AM

## 2019-10-05 NOTE — Transfer of Care (Signed)
Immediate Anesthesia Transfer of Care Note  Patient: Terri Jefferson  Procedure(s) Performed: COLONOSCOPY WITH PROPOFOL (N/A )  Patient Location: PACU  Anesthesia Type:General  Level of Consciousness: sedated  Airway & Oxygen Therapy: Patient Spontanous Breathing and Patient connected to nasal cannula oxygen  Post-op Assessment: Report given to RN and Post -op Vital signs reviewed and stable  Post vital signs: Reviewed and stable  Last Vitals:  Vitals Value Taken Time  BP 100/67 10/05/19 0919  Temp 36.7 C 10/05/19 0917  Pulse 70 10/05/19 0919  Resp 14 10/05/19 0919  SpO2 100 % 10/05/19 0919    Last Pain:  Vitals:   10/05/19 0917  TempSrc: Temporal  PainSc: Asleep         Complications: No apparent anesthesia complications

## 2019-10-05 NOTE — Anesthesia Preprocedure Evaluation (Signed)
Anesthesia Evaluation  Patient identified by MRN, date of birth, ID band Patient awake    Reviewed: Allergy & Precautions, NPO status , Patient's Chart, lab work & pertinent test results  History of Anesthesia Complications Negative for: history of anesthetic complications  Airway Mallampati: II  TM Distance: >3 FB Neck ROM: Full    Dental no notable dental hx.    Pulmonary asthma , neg sleep apnea, neg COPD,    breath sounds clear to auscultation- rhonchi (-) wheezing      Cardiovascular hypertension, Pt. on medications (-) CAD, (-) Past MI, (-) Cardiac Stents and (-) CABG  Rhythm:Regular Rate:Normal - Systolic murmurs and - Diastolic murmurs    Neuro/Psych neg Seizures negative neurological ROS  negative psych ROS   GI/Hepatic negative GI ROS, Neg liver ROS,   Endo/Other  neg diabetesHypothyroidism   Renal/GU negative Renal ROS     Musculoskeletal  (+) Arthritis ,   Abdominal (+) - obese,   Peds  Hematology negative hematology ROS (+)   Anesthesia Other Findings Past Medical History: No date: Arthritis No date: Asthma No date: Fuchs' corneal dystrophy No date: History of shingles No date: Hypercholesterolemia No date: Hypertension No date: Hypothyroidism   Reproductive/Obstetrics                             Anesthesia Physical Anesthesia Plan  ASA: II  Anesthesia Plan: General   Post-op Pain Management:    Induction: Intravenous  PONV Risk Score and Plan: 2 and Propofol infusion  Airway Management Planned: Natural Airway  Additional Equipment:   Intra-op Plan:   Post-operative Plan:   Informed Consent: I have reviewed the patients History and Physical, chart, labs and discussed the procedure including the risks, benefits and alternatives for the proposed anesthesia with the patient or authorized representative who has indicated his/her understanding and acceptance.      Dental advisory given  Plan Discussed with: CRNA and Anesthesiologist  Anesthesia Plan Comments:         Anesthesia Quick Evaluation

## 2019-10-06 ENCOUNTER — Encounter: Payer: Self-pay | Admitting: *Deleted

## 2019-10-09 DIAGNOSIS — I788 Other diseases of capillaries: Secondary | ICD-10-CM | POA: Diagnosis not present

## 2019-10-09 DIAGNOSIS — L820 Inflamed seborrheic keratosis: Secondary | ICD-10-CM | POA: Diagnosis not present

## 2019-10-09 DIAGNOSIS — L120 Bullous pemphigoid: Secondary | ICD-10-CM | POA: Diagnosis not present

## 2019-10-09 DIAGNOSIS — R208 Other disturbances of skin sensation: Secondary | ICD-10-CM | POA: Diagnosis not present

## 2019-10-09 DIAGNOSIS — L538 Other specified erythematous conditions: Secondary | ICD-10-CM | POA: Diagnosis not present

## 2019-10-09 DIAGNOSIS — J301 Allergic rhinitis due to pollen: Secondary | ICD-10-CM | POA: Diagnosis not present

## 2019-10-09 DIAGNOSIS — J3081 Allergic rhinitis due to animal (cat) (dog) hair and dander: Secondary | ICD-10-CM | POA: Diagnosis not present

## 2019-10-09 DIAGNOSIS — J3089 Other allergic rhinitis: Secondary | ICD-10-CM | POA: Diagnosis not present

## 2019-10-09 DIAGNOSIS — L309 Dermatitis, unspecified: Secondary | ICD-10-CM | POA: Diagnosis not present

## 2019-10-13 DIAGNOSIS — I10 Essential (primary) hypertension: Secondary | ICD-10-CM | POA: Diagnosis not present

## 2019-10-13 DIAGNOSIS — R7303 Prediabetes: Secondary | ICD-10-CM | POA: Diagnosis not present

## 2019-10-13 DIAGNOSIS — E7800 Pure hypercholesterolemia, unspecified: Secondary | ICD-10-CM | POA: Diagnosis not present

## 2019-10-16 DIAGNOSIS — J3081 Allergic rhinitis due to animal (cat) (dog) hair and dander: Secondary | ICD-10-CM | POA: Diagnosis not present

## 2019-10-16 DIAGNOSIS — J301 Allergic rhinitis due to pollen: Secondary | ICD-10-CM | POA: Diagnosis not present

## 2019-10-16 DIAGNOSIS — J3089 Other allergic rhinitis: Secondary | ICD-10-CM | POA: Diagnosis not present

## 2019-10-19 DIAGNOSIS — N1831 Chronic kidney disease, stage 3a: Secondary | ICD-10-CM | POA: Diagnosis not present

## 2019-10-19 DIAGNOSIS — E039 Hypothyroidism, unspecified: Secondary | ICD-10-CM | POA: Diagnosis not present

## 2019-10-19 DIAGNOSIS — R7303 Prediabetes: Secondary | ICD-10-CM | POA: Diagnosis not present

## 2019-10-19 DIAGNOSIS — I10 Essential (primary) hypertension: Secondary | ICD-10-CM | POA: Diagnosis not present

## 2019-10-19 DIAGNOSIS — Z0000 Encounter for general adult medical examination without abnormal findings: Secondary | ICD-10-CM | POA: Diagnosis not present

## 2019-10-19 DIAGNOSIS — L100 Pemphigus vulgaris: Secondary | ICD-10-CM | POA: Diagnosis not present

## 2019-10-19 DIAGNOSIS — E7800 Pure hypercholesterolemia, unspecified: Secondary | ICD-10-CM | POA: Diagnosis not present

## 2019-10-23 DIAGNOSIS — J3089 Other allergic rhinitis: Secondary | ICD-10-CM | POA: Diagnosis not present

## 2019-10-23 DIAGNOSIS — J301 Allergic rhinitis due to pollen: Secondary | ICD-10-CM | POA: Diagnosis not present

## 2019-10-23 DIAGNOSIS — J3081 Allergic rhinitis due to animal (cat) (dog) hair and dander: Secondary | ICD-10-CM | POA: Diagnosis not present

## 2019-10-23 DIAGNOSIS — L309 Dermatitis, unspecified: Secondary | ICD-10-CM | POA: Diagnosis not present

## 2019-10-30 DIAGNOSIS — J3089 Other allergic rhinitis: Secondary | ICD-10-CM | POA: Diagnosis not present

## 2019-10-30 DIAGNOSIS — J301 Allergic rhinitis due to pollen: Secondary | ICD-10-CM | POA: Diagnosis not present

## 2019-10-30 DIAGNOSIS — J3081 Allergic rhinitis due to animal (cat) (dog) hair and dander: Secondary | ICD-10-CM | POA: Diagnosis not present

## 2019-11-05 DIAGNOSIS — J3089 Other allergic rhinitis: Secondary | ICD-10-CM | POA: Diagnosis not present

## 2019-11-05 DIAGNOSIS — J301 Allergic rhinitis due to pollen: Secondary | ICD-10-CM | POA: Diagnosis not present

## 2019-11-05 DIAGNOSIS — J3081 Allergic rhinitis due to animal (cat) (dog) hair and dander: Secondary | ICD-10-CM | POA: Diagnosis not present

## 2019-11-13 DIAGNOSIS — J3089 Other allergic rhinitis: Secondary | ICD-10-CM | POA: Diagnosis not present

## 2019-11-13 DIAGNOSIS — J301 Allergic rhinitis due to pollen: Secondary | ICD-10-CM | POA: Diagnosis not present

## 2019-11-13 DIAGNOSIS — J3081 Allergic rhinitis due to animal (cat) (dog) hair and dander: Secondary | ICD-10-CM | POA: Diagnosis not present

## 2019-11-20 DIAGNOSIS — J301 Allergic rhinitis due to pollen: Secondary | ICD-10-CM | POA: Diagnosis not present

## 2019-11-20 DIAGNOSIS — J3089 Other allergic rhinitis: Secondary | ICD-10-CM | POA: Diagnosis not present

## 2019-11-20 DIAGNOSIS — J3081 Allergic rhinitis due to animal (cat) (dog) hair and dander: Secondary | ICD-10-CM | POA: Diagnosis not present

## 2019-11-25 DIAGNOSIS — L120 Bullous pemphigoid: Secondary | ICD-10-CM | POA: Diagnosis not present

## 2019-11-27 DIAGNOSIS — J301 Allergic rhinitis due to pollen: Secondary | ICD-10-CM | POA: Diagnosis not present

## 2019-11-27 DIAGNOSIS — J3089 Other allergic rhinitis: Secondary | ICD-10-CM | POA: Diagnosis not present

## 2019-11-27 DIAGNOSIS — J3081 Allergic rhinitis due to animal (cat) (dog) hair and dander: Secondary | ICD-10-CM | POA: Diagnosis not present

## 2019-11-30 DIAGNOSIS — L1081 Paraneoplastic pemphigus: Secondary | ICD-10-CM | POA: Diagnosis not present

## 2019-11-30 DIAGNOSIS — L100 Pemphigus vulgaris: Secondary | ICD-10-CM | POA: Diagnosis not present

## 2019-12-04 DIAGNOSIS — J3089 Other allergic rhinitis: Secondary | ICD-10-CM | POA: Diagnosis not present

## 2019-12-04 DIAGNOSIS — J301 Allergic rhinitis due to pollen: Secondary | ICD-10-CM | POA: Diagnosis not present

## 2019-12-04 DIAGNOSIS — J3081 Allergic rhinitis due to animal (cat) (dog) hair and dander: Secondary | ICD-10-CM | POA: Diagnosis not present

## 2019-12-08 DIAGNOSIS — J301 Allergic rhinitis due to pollen: Secondary | ICD-10-CM | POA: Diagnosis not present

## 2019-12-08 DIAGNOSIS — J3089 Other allergic rhinitis: Secondary | ICD-10-CM | POA: Diagnosis not present

## 2019-12-08 DIAGNOSIS — J3081 Allergic rhinitis due to animal (cat) (dog) hair and dander: Secondary | ICD-10-CM | POA: Diagnosis not present

## 2019-12-11 DIAGNOSIS — J301 Allergic rhinitis due to pollen: Secondary | ICD-10-CM | POA: Diagnosis not present

## 2019-12-11 DIAGNOSIS — J3081 Allergic rhinitis due to animal (cat) (dog) hair and dander: Secondary | ICD-10-CM | POA: Diagnosis not present

## 2019-12-11 DIAGNOSIS — J3089 Other allergic rhinitis: Secondary | ICD-10-CM | POA: Diagnosis not present

## 2019-12-15 DIAGNOSIS — J301 Allergic rhinitis due to pollen: Secondary | ICD-10-CM | POA: Diagnosis not present

## 2019-12-15 DIAGNOSIS — J3081 Allergic rhinitis due to animal (cat) (dog) hair and dander: Secondary | ICD-10-CM | POA: Diagnosis not present

## 2019-12-15 DIAGNOSIS — J3089 Other allergic rhinitis: Secondary | ICD-10-CM | POA: Diagnosis not present

## 2019-12-18 DIAGNOSIS — J301 Allergic rhinitis due to pollen: Secondary | ICD-10-CM | POA: Diagnosis not present

## 2019-12-18 DIAGNOSIS — J3081 Allergic rhinitis due to animal (cat) (dog) hair and dander: Secondary | ICD-10-CM | POA: Diagnosis not present

## 2019-12-18 DIAGNOSIS — J3089 Other allergic rhinitis: Secondary | ICD-10-CM | POA: Diagnosis not present

## 2019-12-25 DIAGNOSIS — J3089 Other allergic rhinitis: Secondary | ICD-10-CM | POA: Diagnosis not present

## 2019-12-25 DIAGNOSIS — J301 Allergic rhinitis due to pollen: Secondary | ICD-10-CM | POA: Diagnosis not present

## 2019-12-25 DIAGNOSIS — J3081 Allergic rhinitis due to animal (cat) (dog) hair and dander: Secondary | ICD-10-CM | POA: Diagnosis not present

## 2020-01-01 DIAGNOSIS — J3089 Other allergic rhinitis: Secondary | ICD-10-CM | POA: Diagnosis not present

## 2020-01-01 DIAGNOSIS — J3081 Allergic rhinitis due to animal (cat) (dog) hair and dander: Secondary | ICD-10-CM | POA: Diagnosis not present

## 2020-01-01 DIAGNOSIS — J301 Allergic rhinitis due to pollen: Secondary | ICD-10-CM | POA: Diagnosis not present

## 2020-01-08 DIAGNOSIS — J3089 Other allergic rhinitis: Secondary | ICD-10-CM | POA: Diagnosis not present

## 2020-01-08 DIAGNOSIS — J301 Allergic rhinitis due to pollen: Secondary | ICD-10-CM | POA: Diagnosis not present

## 2020-01-08 DIAGNOSIS — J3081 Allergic rhinitis due to animal (cat) (dog) hair and dander: Secondary | ICD-10-CM | POA: Diagnosis not present

## 2020-01-15 DIAGNOSIS — J3081 Allergic rhinitis due to animal (cat) (dog) hair and dander: Secondary | ICD-10-CM | POA: Diagnosis not present

## 2020-01-15 DIAGNOSIS — J3089 Other allergic rhinitis: Secondary | ICD-10-CM | POA: Diagnosis not present

## 2020-01-15 DIAGNOSIS — J301 Allergic rhinitis due to pollen: Secondary | ICD-10-CM | POA: Diagnosis not present

## 2020-01-22 DIAGNOSIS — J4520 Mild intermittent asthma, uncomplicated: Secondary | ICD-10-CM | POA: Diagnosis not present

## 2020-01-22 DIAGNOSIS — J3081 Allergic rhinitis due to animal (cat) (dog) hair and dander: Secondary | ICD-10-CM | POA: Diagnosis not present

## 2020-01-22 DIAGNOSIS — J301 Allergic rhinitis due to pollen: Secondary | ICD-10-CM | POA: Diagnosis not present

## 2020-01-22 DIAGNOSIS — J3089 Other allergic rhinitis: Secondary | ICD-10-CM | POA: Diagnosis not present

## 2020-01-26 DIAGNOSIS — Z79899 Other long term (current) drug therapy: Secondary | ICD-10-CM | POA: Diagnosis not present

## 2020-01-26 DIAGNOSIS — L100 Pemphigus vulgaris: Secondary | ICD-10-CM | POA: Diagnosis not present

## 2020-01-29 DIAGNOSIS — J3089 Other allergic rhinitis: Secondary | ICD-10-CM | POA: Diagnosis not present

## 2020-01-29 DIAGNOSIS — J301 Allergic rhinitis due to pollen: Secondary | ICD-10-CM | POA: Diagnosis not present

## 2020-01-29 DIAGNOSIS — J3081 Allergic rhinitis due to animal (cat) (dog) hair and dander: Secondary | ICD-10-CM | POA: Diagnosis not present

## 2020-02-05 DIAGNOSIS — J3089 Other allergic rhinitis: Secondary | ICD-10-CM | POA: Diagnosis not present

## 2020-02-05 DIAGNOSIS — J301 Allergic rhinitis due to pollen: Secondary | ICD-10-CM | POA: Diagnosis not present

## 2020-02-05 DIAGNOSIS — J3081 Allergic rhinitis due to animal (cat) (dog) hair and dander: Secondary | ICD-10-CM | POA: Diagnosis not present

## 2020-02-12 DIAGNOSIS — J3081 Allergic rhinitis due to animal (cat) (dog) hair and dander: Secondary | ICD-10-CM | POA: Diagnosis not present

## 2020-02-12 DIAGNOSIS — J301 Allergic rhinitis due to pollen: Secondary | ICD-10-CM | POA: Diagnosis not present

## 2020-02-12 DIAGNOSIS — J3089 Other allergic rhinitis: Secondary | ICD-10-CM | POA: Diagnosis not present

## 2020-02-19 DIAGNOSIS — J3081 Allergic rhinitis due to animal (cat) (dog) hair and dander: Secondary | ICD-10-CM | POA: Diagnosis not present

## 2020-02-19 DIAGNOSIS — J301 Allergic rhinitis due to pollen: Secondary | ICD-10-CM | POA: Diagnosis not present

## 2020-02-19 DIAGNOSIS — J3089 Other allergic rhinitis: Secondary | ICD-10-CM | POA: Diagnosis not present

## 2020-02-26 DIAGNOSIS — Z79899 Other long term (current) drug therapy: Secondary | ICD-10-CM | POA: Diagnosis not present

## 2020-02-26 DIAGNOSIS — L100 Pemphigus vulgaris: Secondary | ICD-10-CM | POA: Diagnosis not present

## 2020-02-26 DIAGNOSIS — J3089 Other allergic rhinitis: Secondary | ICD-10-CM | POA: Diagnosis not present

## 2020-02-26 DIAGNOSIS — J301 Allergic rhinitis due to pollen: Secondary | ICD-10-CM | POA: Diagnosis not present

## 2020-02-26 DIAGNOSIS — J3081 Allergic rhinitis due to animal (cat) (dog) hair and dander: Secondary | ICD-10-CM | POA: Diagnosis not present

## 2020-03-04 DIAGNOSIS — J301 Allergic rhinitis due to pollen: Secondary | ICD-10-CM | POA: Diagnosis not present

## 2020-03-04 DIAGNOSIS — J3089 Other allergic rhinitis: Secondary | ICD-10-CM | POA: Diagnosis not present

## 2020-03-04 DIAGNOSIS — J3081 Allergic rhinitis due to animal (cat) (dog) hair and dander: Secondary | ICD-10-CM | POA: Diagnosis not present

## 2020-03-11 DIAGNOSIS — Z79899 Other long term (current) drug therapy: Secondary | ICD-10-CM | POA: Diagnosis not present

## 2020-03-11 DIAGNOSIS — D1801 Hemangioma of skin and subcutaneous tissue: Secondary | ICD-10-CM | POA: Diagnosis not present

## 2020-03-11 DIAGNOSIS — L100 Pemphigus vulgaris: Secondary | ICD-10-CM | POA: Diagnosis not present

## 2020-03-11 DIAGNOSIS — J301 Allergic rhinitis due to pollen: Secondary | ICD-10-CM | POA: Diagnosis not present

## 2020-03-11 DIAGNOSIS — J3081 Allergic rhinitis due to animal (cat) (dog) hair and dander: Secondary | ICD-10-CM | POA: Diagnosis not present

## 2020-03-11 DIAGNOSIS — J3089 Other allergic rhinitis: Secondary | ICD-10-CM | POA: Diagnosis not present

## 2020-03-18 DIAGNOSIS — J301 Allergic rhinitis due to pollen: Secondary | ICD-10-CM | POA: Diagnosis not present

## 2020-03-18 DIAGNOSIS — J3089 Other allergic rhinitis: Secondary | ICD-10-CM | POA: Diagnosis not present

## 2020-03-18 DIAGNOSIS — J3081 Allergic rhinitis due to animal (cat) (dog) hair and dander: Secondary | ICD-10-CM | POA: Diagnosis not present

## 2020-03-24 DIAGNOSIS — J3081 Allergic rhinitis due to animal (cat) (dog) hair and dander: Secondary | ICD-10-CM | POA: Diagnosis not present

## 2020-03-24 DIAGNOSIS — J301 Allergic rhinitis due to pollen: Secondary | ICD-10-CM | POA: Diagnosis not present

## 2020-03-24 DIAGNOSIS — J3089 Other allergic rhinitis: Secondary | ICD-10-CM | POA: Diagnosis not present

## 2020-03-25 DIAGNOSIS — J3089 Other allergic rhinitis: Secondary | ICD-10-CM | POA: Diagnosis not present

## 2020-03-25 DIAGNOSIS — J301 Allergic rhinitis due to pollen: Secondary | ICD-10-CM | POA: Diagnosis not present

## 2020-03-25 DIAGNOSIS — J3081 Allergic rhinitis due to animal (cat) (dog) hair and dander: Secondary | ICD-10-CM | POA: Diagnosis not present

## 2020-04-01 DIAGNOSIS — J301 Allergic rhinitis due to pollen: Secondary | ICD-10-CM | POA: Diagnosis not present

## 2020-04-01 DIAGNOSIS — J3089 Other allergic rhinitis: Secondary | ICD-10-CM | POA: Diagnosis not present

## 2020-04-01 DIAGNOSIS — J3081 Allergic rhinitis due to animal (cat) (dog) hair and dander: Secondary | ICD-10-CM | POA: Diagnosis not present

## 2020-04-12 DIAGNOSIS — E039 Hypothyroidism, unspecified: Secondary | ICD-10-CM | POA: Diagnosis not present

## 2020-04-12 DIAGNOSIS — R7303 Prediabetes: Secondary | ICD-10-CM | POA: Diagnosis not present

## 2020-04-12 DIAGNOSIS — E7800 Pure hypercholesterolemia, unspecified: Secondary | ICD-10-CM | POA: Diagnosis not present

## 2020-04-15 DIAGNOSIS — J301 Allergic rhinitis due to pollen: Secondary | ICD-10-CM | POA: Diagnosis not present

## 2020-04-15 DIAGNOSIS — J3089 Other allergic rhinitis: Secondary | ICD-10-CM | POA: Diagnosis not present

## 2020-04-15 DIAGNOSIS — H40003 Preglaucoma, unspecified, bilateral: Secondary | ICD-10-CM | POA: Diagnosis not present

## 2020-04-15 DIAGNOSIS — J3081 Allergic rhinitis due to animal (cat) (dog) hair and dander: Secondary | ICD-10-CM | POA: Diagnosis not present

## 2020-04-19 DIAGNOSIS — E7800 Pure hypercholesterolemia, unspecified: Secondary | ICD-10-CM | POA: Diagnosis not present

## 2020-04-19 DIAGNOSIS — I10 Essential (primary) hypertension: Secondary | ICD-10-CM | POA: Diagnosis not present

## 2020-04-19 DIAGNOSIS — N1831 Chronic kidney disease, stage 3a: Secondary | ICD-10-CM | POA: Diagnosis not present

## 2020-04-19 DIAGNOSIS — E039 Hypothyroidism, unspecified: Secondary | ICD-10-CM | POA: Diagnosis not present

## 2020-04-22 DIAGNOSIS — J301 Allergic rhinitis due to pollen: Secondary | ICD-10-CM | POA: Diagnosis not present

## 2020-04-22 DIAGNOSIS — J3089 Other allergic rhinitis: Secondary | ICD-10-CM | POA: Diagnosis not present

## 2020-04-22 DIAGNOSIS — J3081 Allergic rhinitis due to animal (cat) (dog) hair and dander: Secondary | ICD-10-CM | POA: Diagnosis not present

## 2020-04-29 DIAGNOSIS — J3089 Other allergic rhinitis: Secondary | ICD-10-CM | POA: Diagnosis not present

## 2020-04-29 DIAGNOSIS — J301 Allergic rhinitis due to pollen: Secondary | ICD-10-CM | POA: Diagnosis not present

## 2020-04-29 DIAGNOSIS — J3081 Allergic rhinitis due to animal (cat) (dog) hair and dander: Secondary | ICD-10-CM | POA: Diagnosis not present

## 2020-05-05 DIAGNOSIS — J3089 Other allergic rhinitis: Secondary | ICD-10-CM | POA: Diagnosis not present

## 2020-05-05 DIAGNOSIS — J3081 Allergic rhinitis due to animal (cat) (dog) hair and dander: Secondary | ICD-10-CM | POA: Diagnosis not present

## 2020-05-05 DIAGNOSIS — J301 Allergic rhinitis due to pollen: Secondary | ICD-10-CM | POA: Diagnosis not present

## 2020-05-10 DIAGNOSIS — J301 Allergic rhinitis due to pollen: Secondary | ICD-10-CM | POA: Diagnosis not present

## 2020-05-10 DIAGNOSIS — J3089 Other allergic rhinitis: Secondary | ICD-10-CM | POA: Diagnosis not present

## 2020-05-10 DIAGNOSIS — J3081 Allergic rhinitis due to animal (cat) (dog) hair and dander: Secondary | ICD-10-CM | POA: Diagnosis not present

## 2020-05-12 DIAGNOSIS — J301 Allergic rhinitis due to pollen: Secondary | ICD-10-CM | POA: Diagnosis not present

## 2020-05-12 DIAGNOSIS — J3081 Allergic rhinitis due to animal (cat) (dog) hair and dander: Secondary | ICD-10-CM | POA: Diagnosis not present

## 2020-05-12 DIAGNOSIS — J3089 Other allergic rhinitis: Secondary | ICD-10-CM | POA: Diagnosis not present

## 2020-05-19 DIAGNOSIS — J3089 Other allergic rhinitis: Secondary | ICD-10-CM | POA: Diagnosis not present

## 2020-05-19 DIAGNOSIS — J3081 Allergic rhinitis due to animal (cat) (dog) hair and dander: Secondary | ICD-10-CM | POA: Diagnosis not present

## 2020-05-19 DIAGNOSIS — J301 Allergic rhinitis due to pollen: Secondary | ICD-10-CM | POA: Diagnosis not present

## 2020-05-26 DIAGNOSIS — J3081 Allergic rhinitis due to animal (cat) (dog) hair and dander: Secondary | ICD-10-CM | POA: Diagnosis not present

## 2020-05-26 DIAGNOSIS — J301 Allergic rhinitis due to pollen: Secondary | ICD-10-CM | POA: Diagnosis not present

## 2020-05-26 DIAGNOSIS — J3089 Other allergic rhinitis: Secondary | ICD-10-CM | POA: Diagnosis not present

## 2020-06-02 DIAGNOSIS — J3081 Allergic rhinitis due to animal (cat) (dog) hair and dander: Secondary | ICD-10-CM | POA: Diagnosis not present

## 2020-06-02 DIAGNOSIS — J3089 Other allergic rhinitis: Secondary | ICD-10-CM | POA: Diagnosis not present

## 2020-06-02 DIAGNOSIS — J301 Allergic rhinitis due to pollen: Secondary | ICD-10-CM | POA: Diagnosis not present

## 2020-06-09 DIAGNOSIS — J3081 Allergic rhinitis due to animal (cat) (dog) hair and dander: Secondary | ICD-10-CM | POA: Diagnosis not present

## 2020-06-09 DIAGNOSIS — J3089 Other allergic rhinitis: Secondary | ICD-10-CM | POA: Diagnosis not present

## 2020-06-09 DIAGNOSIS — J301 Allergic rhinitis due to pollen: Secondary | ICD-10-CM | POA: Diagnosis not present

## 2020-06-16 DIAGNOSIS — J3089 Other allergic rhinitis: Secondary | ICD-10-CM | POA: Diagnosis not present

## 2020-06-16 DIAGNOSIS — J301 Allergic rhinitis due to pollen: Secondary | ICD-10-CM | POA: Diagnosis not present

## 2020-06-16 DIAGNOSIS — J3081 Allergic rhinitis due to animal (cat) (dog) hair and dander: Secondary | ICD-10-CM | POA: Diagnosis not present

## 2020-06-17 DIAGNOSIS — L100 Pemphigus vulgaris: Secondary | ICD-10-CM | POA: Diagnosis not present

## 2020-06-17 DIAGNOSIS — L820 Inflamed seborrheic keratosis: Secondary | ICD-10-CM | POA: Diagnosis not present

## 2020-06-23 DIAGNOSIS — J3089 Other allergic rhinitis: Secondary | ICD-10-CM | POA: Diagnosis not present

## 2020-06-23 DIAGNOSIS — J301 Allergic rhinitis due to pollen: Secondary | ICD-10-CM | POA: Diagnosis not present

## 2020-06-23 DIAGNOSIS — J3081 Allergic rhinitis due to animal (cat) (dog) hair and dander: Secondary | ICD-10-CM | POA: Diagnosis not present

## 2020-06-30 DIAGNOSIS — J3089 Other allergic rhinitis: Secondary | ICD-10-CM | POA: Diagnosis not present

## 2020-06-30 DIAGNOSIS — J301 Allergic rhinitis due to pollen: Secondary | ICD-10-CM | POA: Diagnosis not present

## 2020-06-30 DIAGNOSIS — J3081 Allergic rhinitis due to animal (cat) (dog) hair and dander: Secondary | ICD-10-CM | POA: Diagnosis not present

## 2020-07-07 DIAGNOSIS — J3081 Allergic rhinitis due to animal (cat) (dog) hair and dander: Secondary | ICD-10-CM | POA: Diagnosis not present

## 2020-07-07 DIAGNOSIS — J301 Allergic rhinitis due to pollen: Secondary | ICD-10-CM | POA: Diagnosis not present

## 2020-07-07 DIAGNOSIS — J3089 Other allergic rhinitis: Secondary | ICD-10-CM | POA: Diagnosis not present

## 2020-07-14 DIAGNOSIS — J3089 Other allergic rhinitis: Secondary | ICD-10-CM | POA: Diagnosis not present

## 2020-07-14 DIAGNOSIS — J3081 Allergic rhinitis due to animal (cat) (dog) hair and dander: Secondary | ICD-10-CM | POA: Diagnosis not present

## 2020-07-14 DIAGNOSIS — J301 Allergic rhinitis due to pollen: Secondary | ICD-10-CM | POA: Diagnosis not present

## 2020-07-21 DIAGNOSIS — J301 Allergic rhinitis due to pollen: Secondary | ICD-10-CM | POA: Diagnosis not present

## 2020-07-21 DIAGNOSIS — J3081 Allergic rhinitis due to animal (cat) (dog) hair and dander: Secondary | ICD-10-CM | POA: Diagnosis not present

## 2020-07-21 DIAGNOSIS — J3089 Other allergic rhinitis: Secondary | ICD-10-CM | POA: Diagnosis not present

## 2020-07-26 ENCOUNTER — Other Ambulatory Visit: Payer: Self-pay | Admitting: Family Medicine

## 2020-07-26 DIAGNOSIS — Z1231 Encounter for screening mammogram for malignant neoplasm of breast: Secondary | ICD-10-CM

## 2020-07-28 DIAGNOSIS — J301 Allergic rhinitis due to pollen: Secondary | ICD-10-CM | POA: Diagnosis not present

## 2020-07-28 DIAGNOSIS — J3089 Other allergic rhinitis: Secondary | ICD-10-CM | POA: Diagnosis not present

## 2020-07-28 DIAGNOSIS — J3081 Allergic rhinitis due to animal (cat) (dog) hair and dander: Secondary | ICD-10-CM | POA: Diagnosis not present

## 2020-08-04 DIAGNOSIS — J3081 Allergic rhinitis due to animal (cat) (dog) hair and dander: Secondary | ICD-10-CM | POA: Diagnosis not present

## 2020-08-04 DIAGNOSIS — J3089 Other allergic rhinitis: Secondary | ICD-10-CM | POA: Diagnosis not present

## 2020-08-04 DIAGNOSIS — J301 Allergic rhinitis due to pollen: Secondary | ICD-10-CM | POA: Diagnosis not present

## 2020-08-11 DIAGNOSIS — J301 Allergic rhinitis due to pollen: Secondary | ICD-10-CM | POA: Diagnosis not present

## 2020-08-11 DIAGNOSIS — J3089 Other allergic rhinitis: Secondary | ICD-10-CM | POA: Diagnosis not present

## 2020-08-11 DIAGNOSIS — J3081 Allergic rhinitis due to animal (cat) (dog) hair and dander: Secondary | ICD-10-CM | POA: Diagnosis not present

## 2020-08-18 DIAGNOSIS — J3081 Allergic rhinitis due to animal (cat) (dog) hair and dander: Secondary | ICD-10-CM | POA: Diagnosis not present

## 2020-08-18 DIAGNOSIS — J301 Allergic rhinitis due to pollen: Secondary | ICD-10-CM | POA: Diagnosis not present

## 2020-08-18 DIAGNOSIS — J3089 Other allergic rhinitis: Secondary | ICD-10-CM | POA: Diagnosis not present

## 2020-08-25 DIAGNOSIS — J301 Allergic rhinitis due to pollen: Secondary | ICD-10-CM | POA: Diagnosis not present

## 2020-08-25 DIAGNOSIS — J3089 Other allergic rhinitis: Secondary | ICD-10-CM | POA: Diagnosis not present

## 2020-08-25 DIAGNOSIS — J3081 Allergic rhinitis due to animal (cat) (dog) hair and dander: Secondary | ICD-10-CM | POA: Diagnosis not present

## 2020-08-29 ENCOUNTER — Other Ambulatory Visit: Payer: Self-pay

## 2020-08-29 ENCOUNTER — Ambulatory Visit
Admission: RE | Admit: 2020-08-29 | Discharge: 2020-08-29 | Disposition: A | Discharge status: Home / Self Care | Payer: PPO | Source: Ambulatory Visit | Attending: Family Medicine | Admitting: Family Medicine

## 2020-08-29 DIAGNOSIS — Z1231 Encounter for screening mammogram for malignant neoplasm of breast: Secondary | ICD-10-CM | POA: Insufficient documentation

## 2020-09-01 DIAGNOSIS — J301 Allergic rhinitis due to pollen: Secondary | ICD-10-CM | POA: Diagnosis not present

## 2020-09-01 DIAGNOSIS — J3081 Allergic rhinitis due to animal (cat) (dog) hair and dander: Secondary | ICD-10-CM | POA: Diagnosis not present

## 2020-09-01 DIAGNOSIS — J3089 Other allergic rhinitis: Secondary | ICD-10-CM | POA: Diagnosis not present

## 2020-09-15 DIAGNOSIS — J3081 Allergic rhinitis due to animal (cat) (dog) hair and dander: Secondary | ICD-10-CM | POA: Diagnosis not present

## 2020-09-15 DIAGNOSIS — J3089 Other allergic rhinitis: Secondary | ICD-10-CM | POA: Diagnosis not present

## 2020-09-15 DIAGNOSIS — J301 Allergic rhinitis due to pollen: Secondary | ICD-10-CM | POA: Diagnosis not present

## 2020-09-22 DIAGNOSIS — J301 Allergic rhinitis due to pollen: Secondary | ICD-10-CM | POA: Diagnosis not present

## 2020-09-22 DIAGNOSIS — J3081 Allergic rhinitis due to animal (cat) (dog) hair and dander: Secondary | ICD-10-CM | POA: Diagnosis not present

## 2020-09-22 DIAGNOSIS — J3089 Other allergic rhinitis: Secondary | ICD-10-CM | POA: Diagnosis not present

## 2020-09-29 DIAGNOSIS — J3089 Other allergic rhinitis: Secondary | ICD-10-CM | POA: Diagnosis not present

## 2020-09-29 DIAGNOSIS — J3081 Allergic rhinitis due to animal (cat) (dog) hair and dander: Secondary | ICD-10-CM | POA: Diagnosis not present

## 2020-09-29 DIAGNOSIS — J301 Allergic rhinitis due to pollen: Secondary | ICD-10-CM | POA: Diagnosis not present

## 2020-10-04 DIAGNOSIS — J3081 Allergic rhinitis due to animal (cat) (dog) hair and dander: Secondary | ICD-10-CM | POA: Diagnosis not present

## 2020-10-04 DIAGNOSIS — J3089 Other allergic rhinitis: Secondary | ICD-10-CM | POA: Diagnosis not present

## 2020-10-04 DIAGNOSIS — J301 Allergic rhinitis due to pollen: Secondary | ICD-10-CM | POA: Diagnosis not present

## 2020-10-06 DIAGNOSIS — J301 Allergic rhinitis due to pollen: Secondary | ICD-10-CM | POA: Diagnosis not present

## 2020-10-06 DIAGNOSIS — J3089 Other allergic rhinitis: Secondary | ICD-10-CM | POA: Diagnosis not present

## 2020-10-06 DIAGNOSIS — J3081 Allergic rhinitis due to animal (cat) (dog) hair and dander: Secondary | ICD-10-CM | POA: Diagnosis not present

## 2020-10-12 DIAGNOSIS — E7800 Pure hypercholesterolemia, unspecified: Secondary | ICD-10-CM | POA: Diagnosis not present

## 2020-10-12 DIAGNOSIS — I10 Essential (primary) hypertension: Secondary | ICD-10-CM | POA: Diagnosis not present

## 2020-10-12 DIAGNOSIS — E039 Hypothyroidism, unspecified: Secondary | ICD-10-CM | POA: Diagnosis not present

## 2020-10-13 DIAGNOSIS — J3089 Other allergic rhinitis: Secondary | ICD-10-CM | POA: Diagnosis not present

## 2020-10-13 DIAGNOSIS — J301 Allergic rhinitis due to pollen: Secondary | ICD-10-CM | POA: Diagnosis not present

## 2020-10-13 DIAGNOSIS — J3081 Allergic rhinitis due to animal (cat) (dog) hair and dander: Secondary | ICD-10-CM | POA: Diagnosis not present

## 2020-10-20 DIAGNOSIS — J301 Allergic rhinitis due to pollen: Secondary | ICD-10-CM | POA: Diagnosis not present

## 2020-10-20 DIAGNOSIS — J3089 Other allergic rhinitis: Secondary | ICD-10-CM | POA: Diagnosis not present

## 2020-10-20 DIAGNOSIS — J3081 Allergic rhinitis due to animal (cat) (dog) hair and dander: Secondary | ICD-10-CM | POA: Diagnosis not present

## 2020-10-27 DIAGNOSIS — J301 Allergic rhinitis due to pollen: Secondary | ICD-10-CM | POA: Diagnosis not present

## 2020-10-27 DIAGNOSIS — J3081 Allergic rhinitis due to animal (cat) (dog) hair and dander: Secondary | ICD-10-CM | POA: Diagnosis not present

## 2020-10-27 DIAGNOSIS — J3089 Other allergic rhinitis: Secondary | ICD-10-CM | POA: Diagnosis not present

## 2020-11-10 DIAGNOSIS — J301 Allergic rhinitis due to pollen: Secondary | ICD-10-CM | POA: Diagnosis not present

## 2020-11-10 DIAGNOSIS — J3089 Other allergic rhinitis: Secondary | ICD-10-CM | POA: Diagnosis not present

## 2020-11-10 DIAGNOSIS — J3081 Allergic rhinitis due to animal (cat) (dog) hair and dander: Secondary | ICD-10-CM | POA: Diagnosis not present

## 2020-11-15 DIAGNOSIS — E039 Hypothyroidism, unspecified: Secondary | ICD-10-CM | POA: Diagnosis not present

## 2020-11-15 DIAGNOSIS — N1831 Chronic kidney disease, stage 3a: Secondary | ICD-10-CM | POA: Diagnosis not present

## 2020-11-15 DIAGNOSIS — E7800 Pure hypercholesterolemia, unspecified: Secondary | ICD-10-CM | POA: Diagnosis not present

## 2020-11-15 DIAGNOSIS — Z23 Encounter for immunization: Secondary | ICD-10-CM | POA: Diagnosis not present

## 2020-11-15 DIAGNOSIS — Z0000 Encounter for general adult medical examination without abnormal findings: Secondary | ICD-10-CM | POA: Diagnosis not present

## 2020-11-15 DIAGNOSIS — I10 Essential (primary) hypertension: Secondary | ICD-10-CM | POA: Diagnosis not present

## 2020-11-17 DIAGNOSIS — J3081 Allergic rhinitis due to animal (cat) (dog) hair and dander: Secondary | ICD-10-CM | POA: Diagnosis not present

## 2020-11-17 DIAGNOSIS — J301 Allergic rhinitis due to pollen: Secondary | ICD-10-CM | POA: Diagnosis not present

## 2020-11-17 DIAGNOSIS — J3089 Other allergic rhinitis: Secondary | ICD-10-CM | POA: Diagnosis not present

## 2020-11-24 DIAGNOSIS — J3089 Other allergic rhinitis: Secondary | ICD-10-CM | POA: Diagnosis not present

## 2020-11-24 DIAGNOSIS — J301 Allergic rhinitis due to pollen: Secondary | ICD-10-CM | POA: Diagnosis not present

## 2020-11-24 DIAGNOSIS — J3081 Allergic rhinitis due to animal (cat) (dog) hair and dander: Secondary | ICD-10-CM | POA: Diagnosis not present

## 2020-12-01 DIAGNOSIS — J3081 Allergic rhinitis due to animal (cat) (dog) hair and dander: Secondary | ICD-10-CM | POA: Diagnosis not present

## 2020-12-01 DIAGNOSIS — J3089 Other allergic rhinitis: Secondary | ICD-10-CM | POA: Diagnosis not present

## 2020-12-01 DIAGNOSIS — J301 Allergic rhinitis due to pollen: Secondary | ICD-10-CM | POA: Diagnosis not present

## 2020-12-08 DIAGNOSIS — J3089 Other allergic rhinitis: Secondary | ICD-10-CM | POA: Diagnosis not present

## 2020-12-08 DIAGNOSIS — J3081 Allergic rhinitis due to animal (cat) (dog) hair and dander: Secondary | ICD-10-CM | POA: Diagnosis not present

## 2020-12-08 DIAGNOSIS — J301 Allergic rhinitis due to pollen: Secondary | ICD-10-CM | POA: Diagnosis not present

## 2020-12-15 DIAGNOSIS — J3081 Allergic rhinitis due to animal (cat) (dog) hair and dander: Secondary | ICD-10-CM | POA: Diagnosis not present

## 2020-12-15 DIAGNOSIS — J301 Allergic rhinitis due to pollen: Secondary | ICD-10-CM | POA: Diagnosis not present

## 2020-12-15 DIAGNOSIS — J3089 Other allergic rhinitis: Secondary | ICD-10-CM | POA: Diagnosis not present

## 2020-12-22 DIAGNOSIS — J301 Allergic rhinitis due to pollen: Secondary | ICD-10-CM | POA: Diagnosis not present

## 2020-12-22 DIAGNOSIS — J3089 Other allergic rhinitis: Secondary | ICD-10-CM | POA: Diagnosis not present

## 2020-12-22 DIAGNOSIS — J3081 Allergic rhinitis due to animal (cat) (dog) hair and dander: Secondary | ICD-10-CM | POA: Diagnosis not present

## 2020-12-29 DIAGNOSIS — J3081 Allergic rhinitis due to animal (cat) (dog) hair and dander: Secondary | ICD-10-CM | POA: Diagnosis not present

## 2020-12-29 DIAGNOSIS — J301 Allergic rhinitis due to pollen: Secondary | ICD-10-CM | POA: Diagnosis not present

## 2020-12-29 DIAGNOSIS — J3089 Other allergic rhinitis: Secondary | ICD-10-CM | POA: Diagnosis not present

## 2021-01-05 DIAGNOSIS — J3081 Allergic rhinitis due to animal (cat) (dog) hair and dander: Secondary | ICD-10-CM | POA: Diagnosis not present

## 2021-01-05 DIAGNOSIS — J3089 Other allergic rhinitis: Secondary | ICD-10-CM | POA: Diagnosis not present

## 2021-01-05 DIAGNOSIS — J301 Allergic rhinitis due to pollen: Secondary | ICD-10-CM | POA: Diagnosis not present

## 2021-01-12 DIAGNOSIS — J3081 Allergic rhinitis due to animal (cat) (dog) hair and dander: Secondary | ICD-10-CM | POA: Diagnosis not present

## 2021-01-12 DIAGNOSIS — J3089 Other allergic rhinitis: Secondary | ICD-10-CM | POA: Diagnosis not present

## 2021-01-12 DIAGNOSIS — J301 Allergic rhinitis due to pollen: Secondary | ICD-10-CM | POA: Diagnosis not present

## 2021-01-20 DIAGNOSIS — J3089 Other allergic rhinitis: Secondary | ICD-10-CM | POA: Diagnosis not present

## 2021-01-20 DIAGNOSIS — J4520 Mild intermittent asthma, uncomplicated: Secondary | ICD-10-CM | POA: Diagnosis not present

## 2021-01-20 DIAGNOSIS — J3081 Allergic rhinitis due to animal (cat) (dog) hair and dander: Secondary | ICD-10-CM | POA: Diagnosis not present

## 2021-01-20 DIAGNOSIS — J301 Allergic rhinitis due to pollen: Secondary | ICD-10-CM | POA: Diagnosis not present

## 2021-02-02 DIAGNOSIS — J301 Allergic rhinitis due to pollen: Secondary | ICD-10-CM | POA: Diagnosis not present

## 2021-02-02 DIAGNOSIS — J3081 Allergic rhinitis due to animal (cat) (dog) hair and dander: Secondary | ICD-10-CM | POA: Diagnosis not present

## 2021-02-02 DIAGNOSIS — J3089 Other allergic rhinitis: Secondary | ICD-10-CM | POA: Diagnosis not present

## 2021-02-16 DIAGNOSIS — J301 Allergic rhinitis due to pollen: Secondary | ICD-10-CM | POA: Diagnosis not present

## 2021-02-16 DIAGNOSIS — J3089 Other allergic rhinitis: Secondary | ICD-10-CM | POA: Diagnosis not present

## 2021-02-16 DIAGNOSIS — J3081 Allergic rhinitis due to animal (cat) (dog) hair and dander: Secondary | ICD-10-CM | POA: Diagnosis not present

## 2021-03-02 DIAGNOSIS — J301 Allergic rhinitis due to pollen: Secondary | ICD-10-CM | POA: Diagnosis not present

## 2021-03-02 DIAGNOSIS — J3081 Allergic rhinitis due to animal (cat) (dog) hair and dander: Secondary | ICD-10-CM | POA: Diagnosis not present

## 2021-03-02 DIAGNOSIS — J3089 Other allergic rhinitis: Secondary | ICD-10-CM | POA: Diagnosis not present

## 2021-03-08 DIAGNOSIS — N8110 Cystocele, unspecified: Secondary | ICD-10-CM | POA: Diagnosis not present

## 2021-03-16 DIAGNOSIS — J3081 Allergic rhinitis due to animal (cat) (dog) hair and dander: Secondary | ICD-10-CM | POA: Diagnosis not present

## 2021-03-16 DIAGNOSIS — J3089 Other allergic rhinitis: Secondary | ICD-10-CM | POA: Diagnosis not present

## 2021-03-16 DIAGNOSIS — J301 Allergic rhinitis due to pollen: Secondary | ICD-10-CM | POA: Diagnosis not present

## 2021-03-30 DIAGNOSIS — J3089 Other allergic rhinitis: Secondary | ICD-10-CM | POA: Diagnosis not present

## 2021-03-30 DIAGNOSIS — J3081 Allergic rhinitis due to animal (cat) (dog) hair and dander: Secondary | ICD-10-CM | POA: Diagnosis not present

## 2021-03-30 DIAGNOSIS — J301 Allergic rhinitis due to pollen: Secondary | ICD-10-CM | POA: Diagnosis not present

## 2021-04-13 DIAGNOSIS — J3089 Other allergic rhinitis: Secondary | ICD-10-CM | POA: Diagnosis not present

## 2021-04-13 DIAGNOSIS — J301 Allergic rhinitis due to pollen: Secondary | ICD-10-CM | POA: Diagnosis not present

## 2021-04-13 DIAGNOSIS — J3081 Allergic rhinitis due to animal (cat) (dog) hair and dander: Secondary | ICD-10-CM | POA: Diagnosis not present

## 2021-04-20 DIAGNOSIS — J3081 Allergic rhinitis due to animal (cat) (dog) hair and dander: Secondary | ICD-10-CM | POA: Diagnosis not present

## 2021-04-20 DIAGNOSIS — J3089 Other allergic rhinitis: Secondary | ICD-10-CM | POA: Diagnosis not present

## 2021-04-20 DIAGNOSIS — J301 Allergic rhinitis due to pollen: Secondary | ICD-10-CM | POA: Diagnosis not present

## 2021-04-27 IMAGING — MG MM DIGITAL SCREENING BILAT W/ TOMO AND CAD
8 series · 8 of 24 positions shown · non-contrast
Comparison: Previous exam(s).

CLINICAL DATA: Screening.

EXAM:
DIGITAL SCREENING BILATERAL MAMMOGRAM WITH TOMOSYNTHESIS AND CAD
TECHNIQUE: Bilateral screening digital craniocaudal and mediolateral oblique
mammograms were obtained. Bilateral screening digital breast
tomosynthesis was performed. The images were evaluated with
computer-aided detection.

[R MLO synth-2D]
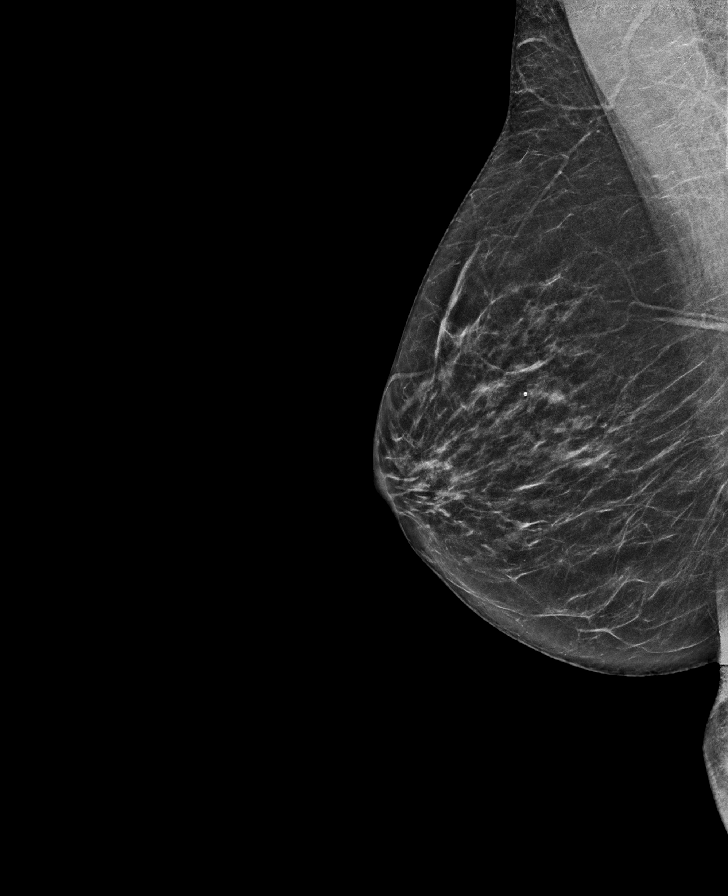

[L MLO synth-2D]
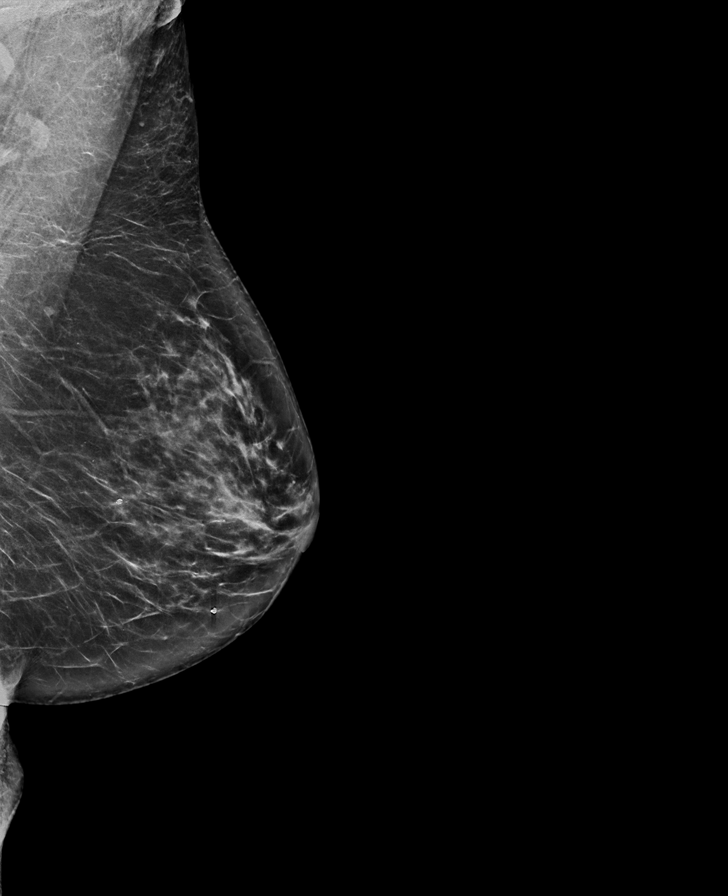

[R CC synth-2D]
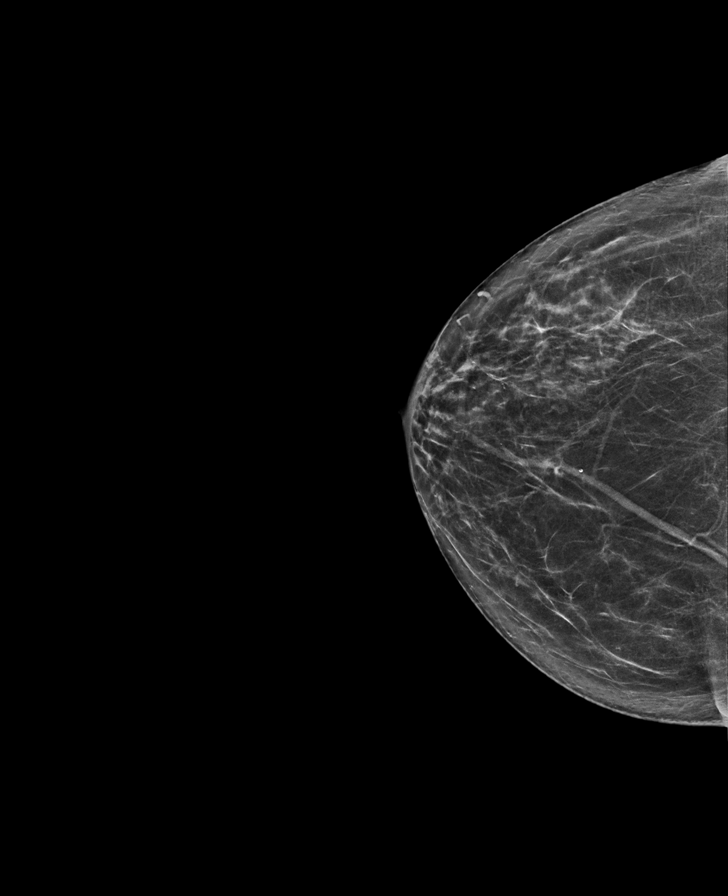

[L CC synth-2D]
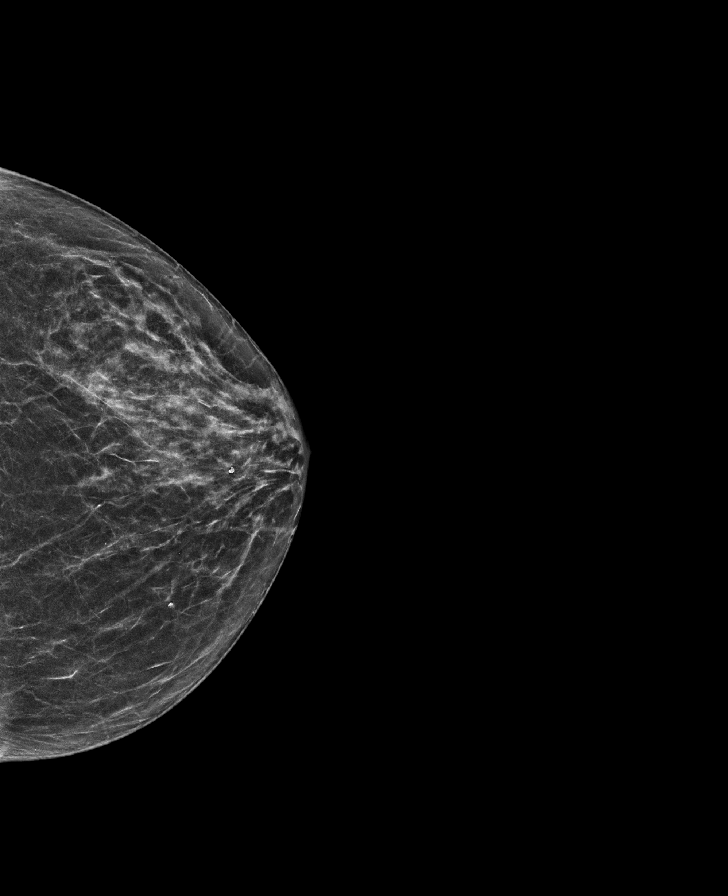

[L MLO tomo · tomo slice 37/72.0]
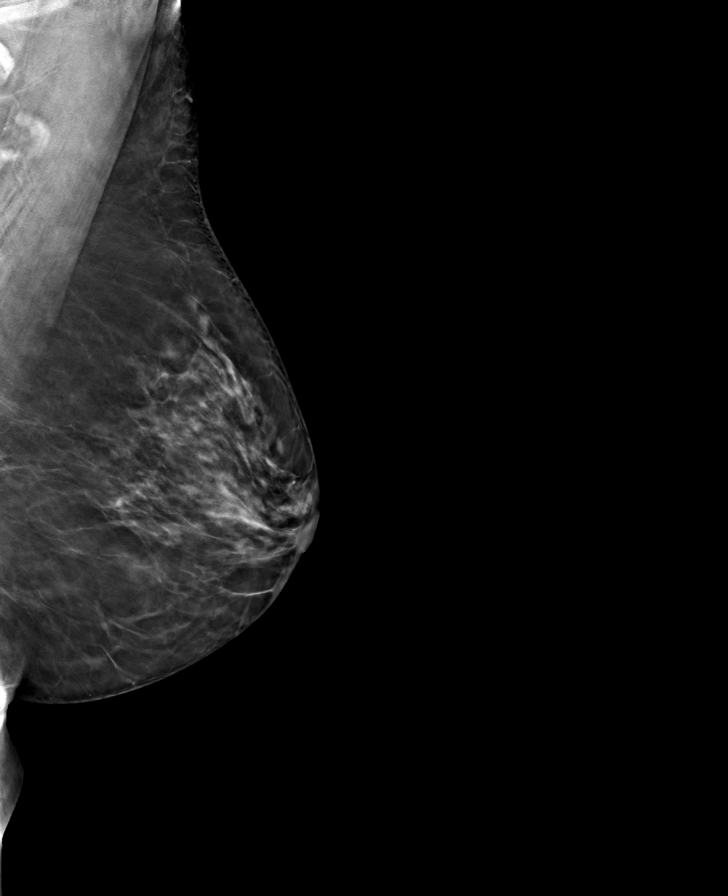

[R MLO tomo · tomo slice 31/62.0]
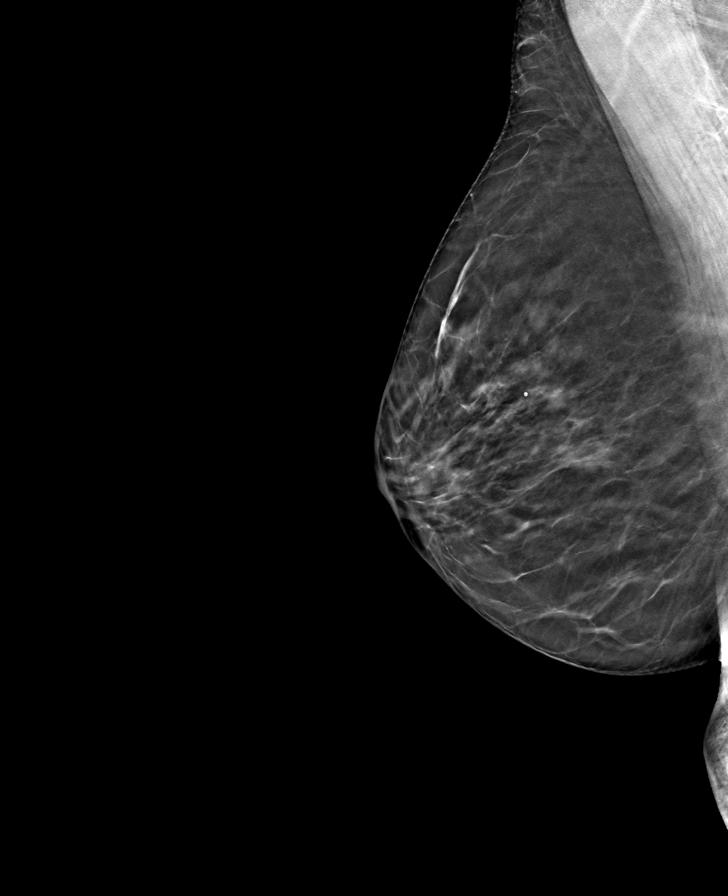

[L CC tomo · tomo slice 30/59.0]
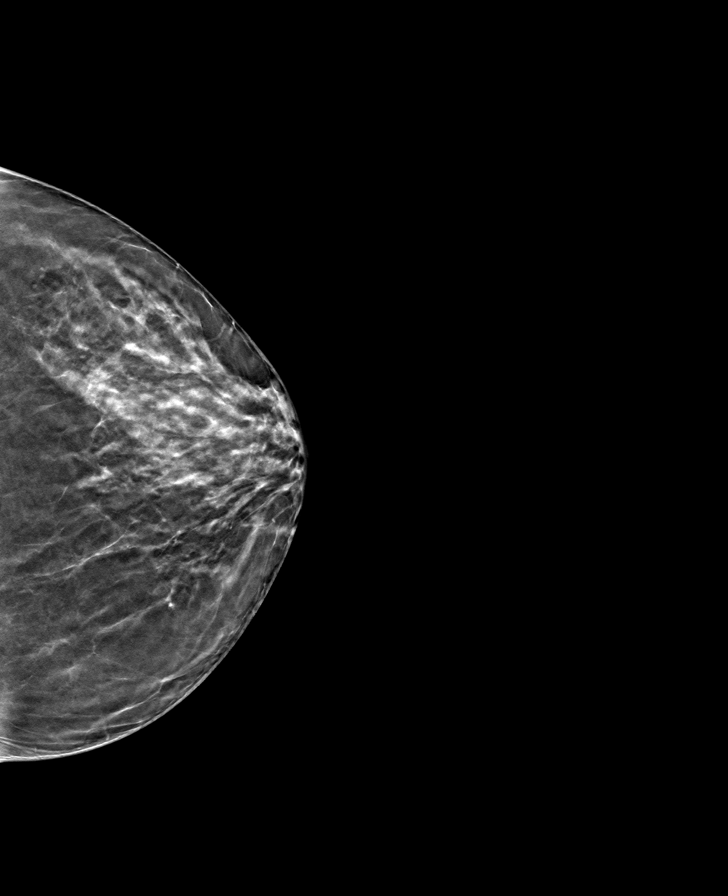

[R CC tomo · tomo slice 32/63.0]
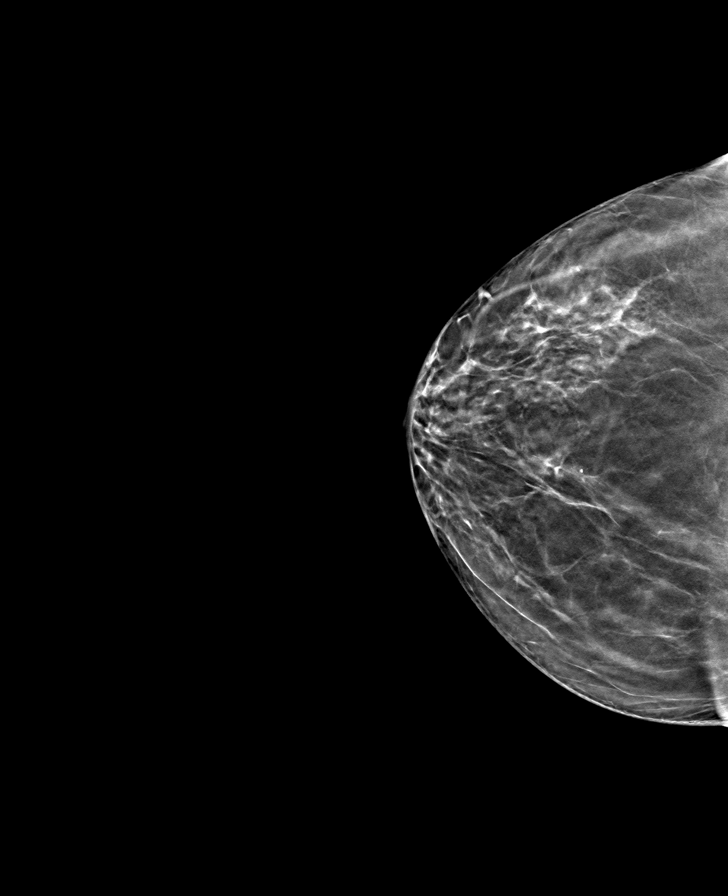

[8 of 24 positions shown; findings below may reference images not displayed]

ACR Breast Density Category c: The breast tissue is heterogeneously
dense, which may obscure small masses.
FINDINGS: There are no findings suspicious for malignancy. The images were
evaluated with computer-aided detection.
IMPRESSION: No mammographic evidence of malignancy. A result letter of this
screening mammogram will be mailed directly to the patient.

RECOMMENDATION:
Screening mammogram in one year. (Code:T4-5-GWO)

BI-RADS CATEGORY  1: Negative.

## 2021-04-28 DIAGNOSIS — J3081 Allergic rhinitis due to animal (cat) (dog) hair and dander: Secondary | ICD-10-CM | POA: Diagnosis not present

## 2021-04-28 DIAGNOSIS — J3089 Other allergic rhinitis: Secondary | ICD-10-CM | POA: Diagnosis not present

## 2021-04-28 DIAGNOSIS — J301 Allergic rhinitis due to pollen: Secondary | ICD-10-CM | POA: Diagnosis not present

## 2021-05-04 DIAGNOSIS — J301 Allergic rhinitis due to pollen: Secondary | ICD-10-CM | POA: Diagnosis not present

## 2021-05-04 DIAGNOSIS — J3081 Allergic rhinitis due to animal (cat) (dog) hair and dander: Secondary | ICD-10-CM | POA: Diagnosis not present

## 2021-05-04 DIAGNOSIS — J3089 Other allergic rhinitis: Secondary | ICD-10-CM | POA: Diagnosis not present

## 2021-05-11 DIAGNOSIS — J3081 Allergic rhinitis due to animal (cat) (dog) hair and dander: Secondary | ICD-10-CM | POA: Diagnosis not present

## 2021-05-11 DIAGNOSIS — E039 Hypothyroidism, unspecified: Secondary | ICD-10-CM | POA: Diagnosis not present

## 2021-05-11 DIAGNOSIS — J301 Allergic rhinitis due to pollen: Secondary | ICD-10-CM | POA: Diagnosis not present

## 2021-05-11 DIAGNOSIS — E7800 Pure hypercholesterolemia, unspecified: Secondary | ICD-10-CM | POA: Diagnosis not present

## 2021-05-11 DIAGNOSIS — J3089 Other allergic rhinitis: Secondary | ICD-10-CM | POA: Diagnosis not present

## 2021-05-18 DIAGNOSIS — J301 Allergic rhinitis due to pollen: Secondary | ICD-10-CM | POA: Diagnosis not present

## 2021-05-18 DIAGNOSIS — E039 Hypothyroidism, unspecified: Secondary | ICD-10-CM | POA: Diagnosis not present

## 2021-05-18 DIAGNOSIS — I10 Essential (primary) hypertension: Secondary | ICD-10-CM | POA: Diagnosis not present

## 2021-05-18 DIAGNOSIS — N1831 Chronic kidney disease, stage 3a: Secondary | ICD-10-CM | POA: Diagnosis not present

## 2021-05-18 DIAGNOSIS — J3081 Allergic rhinitis due to animal (cat) (dog) hair and dander: Secondary | ICD-10-CM | POA: Diagnosis not present

## 2021-05-18 DIAGNOSIS — J3089 Other allergic rhinitis: Secondary | ICD-10-CM | POA: Diagnosis not present

## 2021-05-18 DIAGNOSIS — E7800 Pure hypercholesterolemia, unspecified: Secondary | ICD-10-CM | POA: Diagnosis not present

## 2021-05-25 DIAGNOSIS — J3081 Allergic rhinitis due to animal (cat) (dog) hair and dander: Secondary | ICD-10-CM | POA: Diagnosis not present

## 2021-05-25 DIAGNOSIS — J301 Allergic rhinitis due to pollen: Secondary | ICD-10-CM | POA: Diagnosis not present

## 2021-05-25 DIAGNOSIS — J3089 Other allergic rhinitis: Secondary | ICD-10-CM | POA: Diagnosis not present

## 2021-05-30 DIAGNOSIS — J3081 Allergic rhinitis due to animal (cat) (dog) hair and dander: Secondary | ICD-10-CM | POA: Diagnosis not present

## 2021-05-30 DIAGNOSIS — J301 Allergic rhinitis due to pollen: Secondary | ICD-10-CM | POA: Diagnosis not present

## 2021-05-30 DIAGNOSIS — J3089 Other allergic rhinitis: Secondary | ICD-10-CM | POA: Diagnosis not present

## 2021-06-01 DIAGNOSIS — J3089 Other allergic rhinitis: Secondary | ICD-10-CM | POA: Diagnosis not present

## 2021-06-01 DIAGNOSIS — J301 Allergic rhinitis due to pollen: Secondary | ICD-10-CM | POA: Diagnosis not present

## 2021-06-01 DIAGNOSIS — J3081 Allergic rhinitis due to animal (cat) (dog) hair and dander: Secondary | ICD-10-CM | POA: Diagnosis not present

## 2021-06-06 DIAGNOSIS — J3089 Other allergic rhinitis: Secondary | ICD-10-CM | POA: Diagnosis not present

## 2021-06-06 DIAGNOSIS — J301 Allergic rhinitis due to pollen: Secondary | ICD-10-CM | POA: Diagnosis not present

## 2021-06-06 DIAGNOSIS — J3081 Allergic rhinitis due to animal (cat) (dog) hair and dander: Secondary | ICD-10-CM | POA: Diagnosis not present

## 2021-06-08 DIAGNOSIS — J301 Allergic rhinitis due to pollen: Secondary | ICD-10-CM | POA: Diagnosis not present

## 2021-06-08 DIAGNOSIS — J3081 Allergic rhinitis due to animal (cat) (dog) hair and dander: Secondary | ICD-10-CM | POA: Diagnosis not present

## 2021-06-08 DIAGNOSIS — J3089 Other allergic rhinitis: Secondary | ICD-10-CM | POA: Diagnosis not present

## 2021-06-15 DIAGNOSIS — J3081 Allergic rhinitis due to animal (cat) (dog) hair and dander: Secondary | ICD-10-CM | POA: Diagnosis not present

## 2021-06-15 DIAGNOSIS — J301 Allergic rhinitis due to pollen: Secondary | ICD-10-CM | POA: Diagnosis not present

## 2021-06-15 DIAGNOSIS — J3089 Other allergic rhinitis: Secondary | ICD-10-CM | POA: Diagnosis not present

## 2021-06-19 DIAGNOSIS — L814 Other melanin hyperpigmentation: Secondary | ICD-10-CM | POA: Diagnosis not present

## 2021-06-19 DIAGNOSIS — L821 Other seborrheic keratosis: Secondary | ICD-10-CM | POA: Diagnosis not present

## 2021-06-19 DIAGNOSIS — L100 Pemphigus vulgaris: Secondary | ICD-10-CM | POA: Diagnosis not present

## 2021-06-22 DIAGNOSIS — J301 Allergic rhinitis due to pollen: Secondary | ICD-10-CM | POA: Diagnosis not present

## 2021-06-22 DIAGNOSIS — J3089 Other allergic rhinitis: Secondary | ICD-10-CM | POA: Diagnosis not present

## 2021-06-22 DIAGNOSIS — J3081 Allergic rhinitis due to animal (cat) (dog) hair and dander: Secondary | ICD-10-CM | POA: Diagnosis not present

## 2021-06-26 DIAGNOSIS — I959 Hypotension, unspecified: Secondary | ICD-10-CM | POA: Diagnosis not present

## 2021-06-29 DIAGNOSIS — J3089 Other allergic rhinitis: Secondary | ICD-10-CM | POA: Diagnosis not present

## 2021-06-29 DIAGNOSIS — J301 Allergic rhinitis due to pollen: Secondary | ICD-10-CM | POA: Diagnosis not present

## 2021-06-29 DIAGNOSIS — J3081 Allergic rhinitis due to animal (cat) (dog) hair and dander: Secondary | ICD-10-CM | POA: Diagnosis not present

## 2021-07-04 DIAGNOSIS — R5381 Other malaise: Secondary | ICD-10-CM | POA: Diagnosis not present

## 2021-07-04 DIAGNOSIS — I10 Essential (primary) hypertension: Secondary | ICD-10-CM | POA: Diagnosis not present

## 2021-07-06 DIAGNOSIS — J3081 Allergic rhinitis due to animal (cat) (dog) hair and dander: Secondary | ICD-10-CM | POA: Diagnosis not present

## 2021-07-06 DIAGNOSIS — J3089 Other allergic rhinitis: Secondary | ICD-10-CM | POA: Diagnosis not present

## 2021-07-06 DIAGNOSIS — J301 Allergic rhinitis due to pollen: Secondary | ICD-10-CM | POA: Diagnosis not present

## 2021-07-13 DIAGNOSIS — J3081 Allergic rhinitis due to animal (cat) (dog) hair and dander: Secondary | ICD-10-CM | POA: Diagnosis not present

## 2021-07-13 DIAGNOSIS — J301 Allergic rhinitis due to pollen: Secondary | ICD-10-CM | POA: Diagnosis not present

## 2021-07-13 DIAGNOSIS — J3089 Other allergic rhinitis: Secondary | ICD-10-CM | POA: Diagnosis not present

## 2021-07-20 DIAGNOSIS — J301 Allergic rhinitis due to pollen: Secondary | ICD-10-CM | POA: Diagnosis not present

## 2021-07-20 DIAGNOSIS — J3081 Allergic rhinitis due to animal (cat) (dog) hair and dander: Secondary | ICD-10-CM | POA: Diagnosis not present

## 2021-07-20 DIAGNOSIS — J3089 Other allergic rhinitis: Secondary | ICD-10-CM | POA: Diagnosis not present

## 2021-07-25 ENCOUNTER — Other Ambulatory Visit: Payer: Self-pay | Admitting: Family Medicine

## 2021-07-25 DIAGNOSIS — Z1231 Encounter for screening mammogram for malignant neoplasm of breast: Secondary | ICD-10-CM

## 2021-07-27 DIAGNOSIS — J3089 Other allergic rhinitis: Secondary | ICD-10-CM | POA: Diagnosis not present

## 2021-07-27 DIAGNOSIS — J3081 Allergic rhinitis due to animal (cat) (dog) hair and dander: Secondary | ICD-10-CM | POA: Diagnosis not present

## 2021-07-27 DIAGNOSIS — J301 Allergic rhinitis due to pollen: Secondary | ICD-10-CM | POA: Diagnosis not present

## 2021-08-03 DIAGNOSIS — J3081 Allergic rhinitis due to animal (cat) (dog) hair and dander: Secondary | ICD-10-CM | POA: Diagnosis not present

## 2021-08-03 DIAGNOSIS — J301 Allergic rhinitis due to pollen: Secondary | ICD-10-CM | POA: Diagnosis not present

## 2021-08-03 DIAGNOSIS — J3089 Other allergic rhinitis: Secondary | ICD-10-CM | POA: Diagnosis not present

## 2021-08-10 DIAGNOSIS — J301 Allergic rhinitis due to pollen: Secondary | ICD-10-CM | POA: Diagnosis not present

## 2021-08-10 DIAGNOSIS — J3081 Allergic rhinitis due to animal (cat) (dog) hair and dander: Secondary | ICD-10-CM | POA: Diagnosis not present

## 2021-08-17 DIAGNOSIS — J3089 Other allergic rhinitis: Secondary | ICD-10-CM | POA: Diagnosis not present

## 2021-08-17 DIAGNOSIS — J3081 Allergic rhinitis due to animal (cat) (dog) hair and dander: Secondary | ICD-10-CM | POA: Diagnosis not present

## 2021-08-17 DIAGNOSIS — J301 Allergic rhinitis due to pollen: Secondary | ICD-10-CM | POA: Diagnosis not present

## 2021-08-24 DIAGNOSIS — J3081 Allergic rhinitis due to animal (cat) (dog) hair and dander: Secondary | ICD-10-CM | POA: Diagnosis not present

## 2021-08-24 DIAGNOSIS — J301 Allergic rhinitis due to pollen: Secondary | ICD-10-CM | POA: Diagnosis not present

## 2021-08-24 DIAGNOSIS — J3089 Other allergic rhinitis: Secondary | ICD-10-CM | POA: Diagnosis not present

## 2021-08-30 ENCOUNTER — Ambulatory Visit
Admission: RE | Admit: 2021-08-30 | Discharge: 2021-08-30 | Disposition: A | Discharge status: Home / Self Care | Payer: PPO | Source: Ambulatory Visit | Attending: Family Medicine | Admitting: Family Medicine

## 2021-08-30 DIAGNOSIS — Z1231 Encounter for screening mammogram for malignant neoplasm of breast: Secondary | ICD-10-CM | POA: Diagnosis not present

## 2021-08-31 DIAGNOSIS — J301 Allergic rhinitis due to pollen: Secondary | ICD-10-CM | POA: Diagnosis not present

## 2021-08-31 DIAGNOSIS — J3081 Allergic rhinitis due to animal (cat) (dog) hair and dander: Secondary | ICD-10-CM | POA: Diagnosis not present

## 2021-08-31 DIAGNOSIS — J3089 Other allergic rhinitis: Secondary | ICD-10-CM | POA: Diagnosis not present

## 2021-09-07 DIAGNOSIS — J3081 Allergic rhinitis due to animal (cat) (dog) hair and dander: Secondary | ICD-10-CM | POA: Diagnosis not present

## 2021-09-07 DIAGNOSIS — J3089 Other allergic rhinitis: Secondary | ICD-10-CM | POA: Diagnosis not present

## 2021-09-07 DIAGNOSIS — J301 Allergic rhinitis due to pollen: Secondary | ICD-10-CM | POA: Diagnosis not present

## 2021-09-14 DIAGNOSIS — J3081 Allergic rhinitis due to animal (cat) (dog) hair and dander: Secondary | ICD-10-CM | POA: Diagnosis not present

## 2021-09-14 DIAGNOSIS — J301 Allergic rhinitis due to pollen: Secondary | ICD-10-CM | POA: Diagnosis not present

## 2021-09-14 DIAGNOSIS — J3089 Other allergic rhinitis: Secondary | ICD-10-CM | POA: Diagnosis not present

## 2021-09-21 DIAGNOSIS — J301 Allergic rhinitis due to pollen: Secondary | ICD-10-CM | POA: Diagnosis not present

## 2021-09-21 DIAGNOSIS — J3081 Allergic rhinitis due to animal (cat) (dog) hair and dander: Secondary | ICD-10-CM | POA: Diagnosis not present

## 2021-09-21 DIAGNOSIS — J3089 Other allergic rhinitis: Secondary | ICD-10-CM | POA: Diagnosis not present

## 2021-09-28 DIAGNOSIS — J3089 Other allergic rhinitis: Secondary | ICD-10-CM | POA: Diagnosis not present

## 2021-09-28 DIAGNOSIS — J301 Allergic rhinitis due to pollen: Secondary | ICD-10-CM | POA: Diagnosis not present

## 2021-09-28 DIAGNOSIS — J3081 Allergic rhinitis due to animal (cat) (dog) hair and dander: Secondary | ICD-10-CM | POA: Diagnosis not present

## 2021-09-30 DIAGNOSIS — J301 Allergic rhinitis due to pollen: Secondary | ICD-10-CM | POA: Diagnosis not present

## 2021-09-30 DIAGNOSIS — J3081 Allergic rhinitis due to animal (cat) (dog) hair and dander: Secondary | ICD-10-CM | POA: Diagnosis not present

## 2021-09-30 DIAGNOSIS — J3089 Other allergic rhinitis: Secondary | ICD-10-CM | POA: Diagnosis not present

## 2021-10-05 DIAGNOSIS — J3081 Allergic rhinitis due to animal (cat) (dog) hair and dander: Secondary | ICD-10-CM | POA: Diagnosis not present

## 2021-10-05 DIAGNOSIS — J3089 Other allergic rhinitis: Secondary | ICD-10-CM | POA: Diagnosis not present

## 2021-10-05 DIAGNOSIS — J301 Allergic rhinitis due to pollen: Secondary | ICD-10-CM | POA: Diagnosis not present

## 2021-10-12 DIAGNOSIS — J3089 Other allergic rhinitis: Secondary | ICD-10-CM | POA: Diagnosis not present

## 2021-10-12 DIAGNOSIS — J301 Allergic rhinitis due to pollen: Secondary | ICD-10-CM | POA: Diagnosis not present

## 2021-10-12 DIAGNOSIS — J3081 Allergic rhinitis due to animal (cat) (dog) hair and dander: Secondary | ICD-10-CM | POA: Diagnosis not present

## 2021-10-19 DIAGNOSIS — J3081 Allergic rhinitis due to animal (cat) (dog) hair and dander: Secondary | ICD-10-CM | POA: Diagnosis not present

## 2021-10-19 DIAGNOSIS — J301 Allergic rhinitis due to pollen: Secondary | ICD-10-CM | POA: Diagnosis not present

## 2021-10-19 DIAGNOSIS — J3089 Other allergic rhinitis: Secondary | ICD-10-CM | POA: Diagnosis not present

## 2021-10-24 DIAGNOSIS — J301 Allergic rhinitis due to pollen: Secondary | ICD-10-CM | POA: Diagnosis not present

## 2021-10-24 DIAGNOSIS — J3081 Allergic rhinitis due to animal (cat) (dog) hair and dander: Secondary | ICD-10-CM | POA: Diagnosis not present

## 2021-10-24 DIAGNOSIS — J3089 Other allergic rhinitis: Secondary | ICD-10-CM | POA: Diagnosis not present

## 2021-10-26 DIAGNOSIS — J3081 Allergic rhinitis due to animal (cat) (dog) hair and dander: Secondary | ICD-10-CM | POA: Diagnosis not present

## 2021-10-26 DIAGNOSIS — J3089 Other allergic rhinitis: Secondary | ICD-10-CM | POA: Diagnosis not present

## 2021-10-26 DIAGNOSIS — J301 Allergic rhinitis due to pollen: Secondary | ICD-10-CM | POA: Diagnosis not present

## 2021-10-31 DIAGNOSIS — J3089 Other allergic rhinitis: Secondary | ICD-10-CM | POA: Diagnosis not present

## 2021-10-31 DIAGNOSIS — J3081 Allergic rhinitis due to animal (cat) (dog) hair and dander: Secondary | ICD-10-CM | POA: Diagnosis not present

## 2021-10-31 DIAGNOSIS — J301 Allergic rhinitis due to pollen: Secondary | ICD-10-CM | POA: Diagnosis not present

## 2021-11-02 DIAGNOSIS — J301 Allergic rhinitis due to pollen: Secondary | ICD-10-CM | POA: Diagnosis not present

## 2021-11-02 DIAGNOSIS — J3089 Other allergic rhinitis: Secondary | ICD-10-CM | POA: Diagnosis not present

## 2021-11-02 DIAGNOSIS — J3081 Allergic rhinitis due to animal (cat) (dog) hair and dander: Secondary | ICD-10-CM | POA: Diagnosis not present

## 2021-11-09 DIAGNOSIS — J3089 Other allergic rhinitis: Secondary | ICD-10-CM | POA: Diagnosis not present

## 2021-11-09 DIAGNOSIS — J301 Allergic rhinitis due to pollen: Secondary | ICD-10-CM | POA: Diagnosis not present

## 2021-11-09 DIAGNOSIS — J3081 Allergic rhinitis due to animal (cat) (dog) hair and dander: Secondary | ICD-10-CM | POA: Diagnosis not present

## 2021-11-10 DIAGNOSIS — E039 Hypothyroidism, unspecified: Secondary | ICD-10-CM | POA: Diagnosis not present

## 2021-11-10 DIAGNOSIS — I10 Essential (primary) hypertension: Secondary | ICD-10-CM | POA: Diagnosis not present

## 2021-11-10 DIAGNOSIS — E7800 Pure hypercholesterolemia, unspecified: Secondary | ICD-10-CM | POA: Diagnosis not present

## 2021-11-16 DIAGNOSIS — J3089 Other allergic rhinitis: Secondary | ICD-10-CM | POA: Diagnosis not present

## 2021-11-16 DIAGNOSIS — J3081 Allergic rhinitis due to animal (cat) (dog) hair and dander: Secondary | ICD-10-CM | POA: Diagnosis not present

## 2021-11-16 DIAGNOSIS — J301 Allergic rhinitis due to pollen: Secondary | ICD-10-CM | POA: Diagnosis not present

## 2021-11-17 DIAGNOSIS — E7800 Pure hypercholesterolemia, unspecified: Secondary | ICD-10-CM | POA: Diagnosis not present

## 2021-11-17 DIAGNOSIS — M79642 Pain in left hand: Secondary | ICD-10-CM | POA: Diagnosis not present

## 2021-11-17 DIAGNOSIS — E039 Hypothyroidism, unspecified: Secondary | ICD-10-CM | POA: Diagnosis not present

## 2021-11-17 DIAGNOSIS — M79641 Pain in right hand: Secondary | ICD-10-CM | POA: Diagnosis not present

## 2021-11-17 DIAGNOSIS — N1831 Chronic kidney disease, stage 3a: Secondary | ICD-10-CM | POA: Diagnosis not present

## 2021-11-17 DIAGNOSIS — I10 Essential (primary) hypertension: Secondary | ICD-10-CM | POA: Diagnosis not present

## 2021-11-17 DIAGNOSIS — Z0000 Encounter for general adult medical examination without abnormal findings: Secondary | ICD-10-CM | POA: Diagnosis not present

## 2021-11-23 DIAGNOSIS — J301 Allergic rhinitis due to pollen: Secondary | ICD-10-CM | POA: Diagnosis not present

## 2021-11-23 DIAGNOSIS — J3081 Allergic rhinitis due to animal (cat) (dog) hair and dander: Secondary | ICD-10-CM | POA: Diagnosis not present

## 2021-11-23 DIAGNOSIS — J3089 Other allergic rhinitis: Secondary | ICD-10-CM | POA: Diagnosis not present

## 2021-11-27 DIAGNOSIS — L719 Rosacea, unspecified: Secondary | ICD-10-CM | POA: Diagnosis not present

## 2021-11-27 DIAGNOSIS — L100 Pemphigus vulgaris: Secondary | ICD-10-CM | POA: Diagnosis not present

## 2021-11-27 DIAGNOSIS — L821 Other seborrheic keratosis: Secondary | ICD-10-CM | POA: Diagnosis not present

## 2021-11-27 DIAGNOSIS — L820 Inflamed seborrheic keratosis: Secondary | ICD-10-CM | POA: Diagnosis not present

## 2021-11-30 DIAGNOSIS — J3081 Allergic rhinitis due to animal (cat) (dog) hair and dander: Secondary | ICD-10-CM | POA: Diagnosis not present

## 2021-11-30 DIAGNOSIS — J3089 Other allergic rhinitis: Secondary | ICD-10-CM | POA: Diagnosis not present

## 2021-11-30 DIAGNOSIS — J301 Allergic rhinitis due to pollen: Secondary | ICD-10-CM | POA: Diagnosis not present

## 2021-12-12 DIAGNOSIS — J3089 Other allergic rhinitis: Secondary | ICD-10-CM | POA: Diagnosis not present

## 2021-12-12 DIAGNOSIS — J3081 Allergic rhinitis due to animal (cat) (dog) hair and dander: Secondary | ICD-10-CM | POA: Diagnosis not present

## 2021-12-12 DIAGNOSIS — J301 Allergic rhinitis due to pollen: Secondary | ICD-10-CM | POA: Diagnosis not present

## 2021-12-21 DIAGNOSIS — J3081 Allergic rhinitis due to animal (cat) (dog) hair and dander: Secondary | ICD-10-CM | POA: Diagnosis not present

## 2021-12-21 DIAGNOSIS — J3089 Other allergic rhinitis: Secondary | ICD-10-CM | POA: Diagnosis not present

## 2021-12-21 DIAGNOSIS — J301 Allergic rhinitis due to pollen: Secondary | ICD-10-CM | POA: Diagnosis not present

## 2021-12-28 DIAGNOSIS — J3089 Other allergic rhinitis: Secondary | ICD-10-CM | POA: Diagnosis not present

## 2021-12-28 DIAGNOSIS — J301 Allergic rhinitis due to pollen: Secondary | ICD-10-CM | POA: Diagnosis not present

## 2021-12-28 DIAGNOSIS — J3081 Allergic rhinitis due to animal (cat) (dog) hair and dander: Secondary | ICD-10-CM | POA: Diagnosis not present

## 2022-01-05 DIAGNOSIS — J301 Allergic rhinitis due to pollen: Secondary | ICD-10-CM | POA: Diagnosis not present

## 2022-01-05 DIAGNOSIS — J3089 Other allergic rhinitis: Secondary | ICD-10-CM | POA: Diagnosis not present

## 2022-01-05 DIAGNOSIS — J3081 Allergic rhinitis due to animal (cat) (dog) hair and dander: Secondary | ICD-10-CM | POA: Diagnosis not present

## 2022-01-12 DIAGNOSIS — J3081 Allergic rhinitis due to animal (cat) (dog) hair and dander: Secondary | ICD-10-CM | POA: Diagnosis not present

## 2022-01-12 DIAGNOSIS — J3089 Other allergic rhinitis: Secondary | ICD-10-CM | POA: Diagnosis not present

## 2022-01-12 DIAGNOSIS — J301 Allergic rhinitis due to pollen: Secondary | ICD-10-CM | POA: Diagnosis not present

## 2022-01-18 DIAGNOSIS — J301 Allergic rhinitis due to pollen: Secondary | ICD-10-CM | POA: Diagnosis not present

## 2022-01-18 DIAGNOSIS — J3089 Other allergic rhinitis: Secondary | ICD-10-CM | POA: Diagnosis not present

## 2022-01-18 DIAGNOSIS — J3081 Allergic rhinitis due to animal (cat) (dog) hair and dander: Secondary | ICD-10-CM | POA: Diagnosis not present

## 2022-01-25 DIAGNOSIS — J3089 Other allergic rhinitis: Secondary | ICD-10-CM | POA: Diagnosis not present

## 2022-01-25 DIAGNOSIS — J301 Allergic rhinitis due to pollen: Secondary | ICD-10-CM | POA: Diagnosis not present

## 2022-01-25 DIAGNOSIS — J3081 Allergic rhinitis due to animal (cat) (dog) hair and dander: Secondary | ICD-10-CM | POA: Diagnosis not present

## 2022-02-01 DIAGNOSIS — J3081 Allergic rhinitis due to animal (cat) (dog) hair and dander: Secondary | ICD-10-CM | POA: Diagnosis not present

## 2022-02-01 DIAGNOSIS — J301 Allergic rhinitis due to pollen: Secondary | ICD-10-CM | POA: Diagnosis not present

## 2022-02-01 DIAGNOSIS — J3089 Other allergic rhinitis: Secondary | ICD-10-CM | POA: Diagnosis not present

## 2022-02-06 DIAGNOSIS — J3081 Allergic rhinitis due to animal (cat) (dog) hair and dander: Secondary | ICD-10-CM | POA: Diagnosis not present

## 2022-02-06 DIAGNOSIS — J3089 Other allergic rhinitis: Secondary | ICD-10-CM | POA: Diagnosis not present

## 2022-02-06 DIAGNOSIS — J4520 Mild intermittent asthma, uncomplicated: Secondary | ICD-10-CM | POA: Diagnosis not present

## 2022-02-06 DIAGNOSIS — J301 Allergic rhinitis due to pollen: Secondary | ICD-10-CM | POA: Diagnosis not present

## 2022-02-15 DIAGNOSIS — J301 Allergic rhinitis due to pollen: Secondary | ICD-10-CM | POA: Diagnosis not present

## 2022-02-15 DIAGNOSIS — J3089 Other allergic rhinitis: Secondary | ICD-10-CM | POA: Diagnosis not present

## 2022-02-15 DIAGNOSIS — J3081 Allergic rhinitis due to animal (cat) (dog) hair and dander: Secondary | ICD-10-CM | POA: Diagnosis not present

## 2022-02-22 DIAGNOSIS — J3089 Other allergic rhinitis: Secondary | ICD-10-CM | POA: Diagnosis not present

## 2022-02-22 DIAGNOSIS — J3081 Allergic rhinitis due to animal (cat) (dog) hair and dander: Secondary | ICD-10-CM | POA: Diagnosis not present

## 2022-02-22 DIAGNOSIS — J301 Allergic rhinitis due to pollen: Secondary | ICD-10-CM | POA: Diagnosis not present

## 2022-03-01 DIAGNOSIS — J3089 Other allergic rhinitis: Secondary | ICD-10-CM | POA: Diagnosis not present

## 2022-03-01 DIAGNOSIS — J3081 Allergic rhinitis due to animal (cat) (dog) hair and dander: Secondary | ICD-10-CM | POA: Diagnosis not present

## 2022-03-01 DIAGNOSIS — J301 Allergic rhinitis due to pollen: Secondary | ICD-10-CM | POA: Diagnosis not present

## 2022-03-08 DIAGNOSIS — J301 Allergic rhinitis due to pollen: Secondary | ICD-10-CM | POA: Diagnosis not present

## 2022-03-08 DIAGNOSIS — J3089 Other allergic rhinitis: Secondary | ICD-10-CM | POA: Diagnosis not present

## 2022-03-08 DIAGNOSIS — J3081 Allergic rhinitis due to animal (cat) (dog) hair and dander: Secondary | ICD-10-CM | POA: Diagnosis not present

## 2022-03-15 DIAGNOSIS — J3081 Allergic rhinitis due to animal (cat) (dog) hair and dander: Secondary | ICD-10-CM | POA: Diagnosis not present

## 2022-03-15 DIAGNOSIS — J3089 Other allergic rhinitis: Secondary | ICD-10-CM | POA: Diagnosis not present

## 2022-03-15 DIAGNOSIS — J301 Allergic rhinitis due to pollen: Secondary | ICD-10-CM | POA: Diagnosis not present

## 2022-03-22 DIAGNOSIS — J301 Allergic rhinitis due to pollen: Secondary | ICD-10-CM | POA: Diagnosis not present

## 2022-03-22 DIAGNOSIS — J3089 Other allergic rhinitis: Secondary | ICD-10-CM | POA: Diagnosis not present

## 2022-03-22 DIAGNOSIS — J3081 Allergic rhinitis due to animal (cat) (dog) hair and dander: Secondary | ICD-10-CM | POA: Diagnosis not present

## 2022-03-29 DIAGNOSIS — J301 Allergic rhinitis due to pollen: Secondary | ICD-10-CM | POA: Diagnosis not present

## 2022-03-29 DIAGNOSIS — J3089 Other allergic rhinitis: Secondary | ICD-10-CM | POA: Diagnosis not present

## 2022-03-29 DIAGNOSIS — J3081 Allergic rhinitis due to animal (cat) (dog) hair and dander: Secondary | ICD-10-CM | POA: Diagnosis not present

## 2022-04-02 DIAGNOSIS — L100 Pemphigus vulgaris: Secondary | ICD-10-CM | POA: Diagnosis not present

## 2022-04-02 DIAGNOSIS — L719 Rosacea, unspecified: Secondary | ICD-10-CM | POA: Diagnosis not present

## 2022-04-03 DIAGNOSIS — J3089 Other allergic rhinitis: Secondary | ICD-10-CM | POA: Diagnosis not present

## 2022-04-03 DIAGNOSIS — J301 Allergic rhinitis due to pollen: Secondary | ICD-10-CM | POA: Diagnosis not present

## 2022-04-03 DIAGNOSIS — J3081 Allergic rhinitis due to animal (cat) (dog) hair and dander: Secondary | ICD-10-CM | POA: Diagnosis not present

## 2022-04-10 DIAGNOSIS — J3081 Allergic rhinitis due to animal (cat) (dog) hair and dander: Secondary | ICD-10-CM | POA: Diagnosis not present

## 2022-04-10 DIAGNOSIS — J3089 Other allergic rhinitis: Secondary | ICD-10-CM | POA: Diagnosis not present

## 2022-04-10 DIAGNOSIS — J301 Allergic rhinitis due to pollen: Secondary | ICD-10-CM | POA: Diagnosis not present

## 2022-04-17 DIAGNOSIS — H40003 Preglaucoma, unspecified, bilateral: Secondary | ICD-10-CM | POA: Diagnosis not present

## 2022-04-19 DIAGNOSIS — J3081 Allergic rhinitis due to animal (cat) (dog) hair and dander: Secondary | ICD-10-CM | POA: Diagnosis not present

## 2022-04-19 DIAGNOSIS — J3089 Other allergic rhinitis: Secondary | ICD-10-CM | POA: Diagnosis not present

## 2022-04-19 DIAGNOSIS — J301 Allergic rhinitis due to pollen: Secondary | ICD-10-CM | POA: Diagnosis not present

## 2022-04-26 DIAGNOSIS — J3081 Allergic rhinitis due to animal (cat) (dog) hair and dander: Secondary | ICD-10-CM | POA: Diagnosis not present

## 2022-04-26 DIAGNOSIS — J3089 Other allergic rhinitis: Secondary | ICD-10-CM | POA: Diagnosis not present

## 2022-04-26 DIAGNOSIS — J301 Allergic rhinitis due to pollen: Secondary | ICD-10-CM | POA: Diagnosis not present

## 2022-05-03 DIAGNOSIS — J301 Allergic rhinitis due to pollen: Secondary | ICD-10-CM | POA: Diagnosis not present

## 2022-05-03 DIAGNOSIS — J3089 Other allergic rhinitis: Secondary | ICD-10-CM | POA: Diagnosis not present

## 2022-05-03 DIAGNOSIS — J3081 Allergic rhinitis due to animal (cat) (dog) hair and dander: Secondary | ICD-10-CM | POA: Diagnosis not present

## 2022-05-15 DIAGNOSIS — J301 Allergic rhinitis due to pollen: Secondary | ICD-10-CM | POA: Diagnosis not present

## 2022-05-15 DIAGNOSIS — J4520 Mild intermittent asthma, uncomplicated: Secondary | ICD-10-CM | POA: Diagnosis not present

## 2022-05-15 DIAGNOSIS — J3089 Other allergic rhinitis: Secondary | ICD-10-CM | POA: Diagnosis not present

## 2022-05-15 DIAGNOSIS — J3081 Allergic rhinitis due to animal (cat) (dog) hair and dander: Secondary | ICD-10-CM | POA: Diagnosis not present

## 2022-05-18 DIAGNOSIS — E7800 Pure hypercholesterolemia, unspecified: Secondary | ICD-10-CM | POA: Diagnosis not present

## 2022-05-18 DIAGNOSIS — E039 Hypothyroidism, unspecified: Secondary | ICD-10-CM | POA: Diagnosis not present

## 2022-05-29 DIAGNOSIS — E039 Hypothyroidism, unspecified: Secondary | ICD-10-CM | POA: Diagnosis not present

## 2022-05-29 DIAGNOSIS — N1831 Chronic kidney disease, stage 3a: Secondary | ICD-10-CM | POA: Diagnosis not present

## 2022-05-29 DIAGNOSIS — I10 Essential (primary) hypertension: Secondary | ICD-10-CM | POA: Diagnosis not present

## 2022-05-29 DIAGNOSIS — J019 Acute sinusitis, unspecified: Secondary | ICD-10-CM | POA: Diagnosis not present

## 2022-05-29 DIAGNOSIS — E7800 Pure hypercholesterolemia, unspecified: Secondary | ICD-10-CM | POA: Diagnosis not present

## 2022-05-31 DIAGNOSIS — J3081 Allergic rhinitis due to animal (cat) (dog) hair and dander: Secondary | ICD-10-CM | POA: Diagnosis not present

## 2022-05-31 DIAGNOSIS — J3089 Other allergic rhinitis: Secondary | ICD-10-CM | POA: Diagnosis not present

## 2022-05-31 DIAGNOSIS — J301 Allergic rhinitis due to pollen: Secondary | ICD-10-CM | POA: Diagnosis not present

## 2022-06-14 DIAGNOSIS — J301 Allergic rhinitis due to pollen: Secondary | ICD-10-CM | POA: Diagnosis not present

## 2022-06-14 DIAGNOSIS — J3089 Other allergic rhinitis: Secondary | ICD-10-CM | POA: Diagnosis not present

## 2022-06-14 DIAGNOSIS — J3081 Allergic rhinitis due to animal (cat) (dog) hair and dander: Secondary | ICD-10-CM | POA: Diagnosis not present

## 2022-06-28 DIAGNOSIS — J3089 Other allergic rhinitis: Secondary | ICD-10-CM | POA: Diagnosis not present

## 2022-06-28 DIAGNOSIS — J301 Allergic rhinitis due to pollen: Secondary | ICD-10-CM | POA: Diagnosis not present

## 2022-06-28 DIAGNOSIS — J3081 Allergic rhinitis due to animal (cat) (dog) hair and dander: Secondary | ICD-10-CM | POA: Diagnosis not present

## 2022-07-03 DIAGNOSIS — J301 Allergic rhinitis due to pollen: Secondary | ICD-10-CM | POA: Diagnosis not present

## 2022-07-03 DIAGNOSIS — J3089 Other allergic rhinitis: Secondary | ICD-10-CM | POA: Diagnosis not present

## 2022-07-03 DIAGNOSIS — J3081 Allergic rhinitis due to animal (cat) (dog) hair and dander: Secondary | ICD-10-CM | POA: Diagnosis not present

## 2022-07-19 DIAGNOSIS — J3089 Other allergic rhinitis: Secondary | ICD-10-CM | POA: Diagnosis not present

## 2022-07-19 DIAGNOSIS — J301 Allergic rhinitis due to pollen: Secondary | ICD-10-CM | POA: Diagnosis not present

## 2022-07-19 DIAGNOSIS — J3081 Allergic rhinitis due to animal (cat) (dog) hair and dander: Secondary | ICD-10-CM | POA: Diagnosis not present

## 2022-07-25 ENCOUNTER — Other Ambulatory Visit: Payer: Self-pay | Admitting: Family Medicine

## 2022-07-25 DIAGNOSIS — Z1231 Encounter for screening mammogram for malignant neoplasm of breast: Secondary | ICD-10-CM

## 2022-08-02 DIAGNOSIS — J3089 Other allergic rhinitis: Secondary | ICD-10-CM | POA: Diagnosis not present

## 2022-08-02 DIAGNOSIS — J3081 Allergic rhinitis due to animal (cat) (dog) hair and dander: Secondary | ICD-10-CM | POA: Diagnosis not present

## 2022-08-02 DIAGNOSIS — J301 Allergic rhinitis due to pollen: Secondary | ICD-10-CM | POA: Diagnosis not present

## 2022-08-16 DIAGNOSIS — J3081 Allergic rhinitis due to animal (cat) (dog) hair and dander: Secondary | ICD-10-CM | POA: Diagnosis not present

## 2022-08-16 DIAGNOSIS — J301 Allergic rhinitis due to pollen: Secondary | ICD-10-CM | POA: Diagnosis not present

## 2022-08-16 DIAGNOSIS — J3089 Other allergic rhinitis: Secondary | ICD-10-CM | POA: Diagnosis not present

## 2022-08-30 DIAGNOSIS — J301 Allergic rhinitis due to pollen: Secondary | ICD-10-CM | POA: Diagnosis not present

## 2022-08-30 DIAGNOSIS — J3089 Other allergic rhinitis: Secondary | ICD-10-CM | POA: Diagnosis not present

## 2022-08-30 DIAGNOSIS — J3081 Allergic rhinitis due to animal (cat) (dog) hair and dander: Secondary | ICD-10-CM | POA: Diagnosis not present

## 2022-09-03 ENCOUNTER — Ambulatory Visit
Admission: RE | Admit: 2022-09-03 | Discharge: 2022-09-03 | Disposition: A | Discharge status: Home / Self Care | Payer: PPO | Source: Ambulatory Visit | Attending: Family Medicine | Admitting: Family Medicine

## 2022-09-03 DIAGNOSIS — Z1231 Encounter for screening mammogram for malignant neoplasm of breast: Secondary | ICD-10-CM | POA: Diagnosis not present

## 2022-09-13 DIAGNOSIS — J301 Allergic rhinitis due to pollen: Secondary | ICD-10-CM | POA: Diagnosis not present

## 2022-09-13 DIAGNOSIS — J3089 Other allergic rhinitis: Secondary | ICD-10-CM | POA: Diagnosis not present

## 2022-09-13 DIAGNOSIS — J3081 Allergic rhinitis due to animal (cat) (dog) hair and dander: Secondary | ICD-10-CM | POA: Diagnosis not present

## 2022-09-27 DIAGNOSIS — J3089 Other allergic rhinitis: Secondary | ICD-10-CM | POA: Diagnosis not present

## 2022-09-27 DIAGNOSIS — J3081 Allergic rhinitis due to animal (cat) (dog) hair and dander: Secondary | ICD-10-CM | POA: Diagnosis not present

## 2022-09-27 DIAGNOSIS — J301 Allergic rhinitis due to pollen: Secondary | ICD-10-CM | POA: Diagnosis not present

## 2022-10-11 DIAGNOSIS — J3089 Other allergic rhinitis: Secondary | ICD-10-CM | POA: Diagnosis not present

## 2022-10-11 DIAGNOSIS — J3081 Allergic rhinitis due to animal (cat) (dog) hair and dander: Secondary | ICD-10-CM | POA: Diagnosis not present

## 2022-10-11 DIAGNOSIS — J301 Allergic rhinitis due to pollen: Secondary | ICD-10-CM | POA: Diagnosis not present

## 2022-10-18 DIAGNOSIS — J301 Allergic rhinitis due to pollen: Secondary | ICD-10-CM | POA: Diagnosis not present

## 2022-10-18 DIAGNOSIS — J3081 Allergic rhinitis due to animal (cat) (dog) hair and dander: Secondary | ICD-10-CM | POA: Diagnosis not present

## 2022-10-25 DIAGNOSIS — J301 Allergic rhinitis due to pollen: Secondary | ICD-10-CM | POA: Diagnosis not present

## 2022-10-25 DIAGNOSIS — J3089 Other allergic rhinitis: Secondary | ICD-10-CM | POA: Diagnosis not present

## 2022-10-25 DIAGNOSIS — J3081 Allergic rhinitis due to animal (cat) (dog) hair and dander: Secondary | ICD-10-CM | POA: Diagnosis not present

## 2022-11-08 DIAGNOSIS — J301 Allergic rhinitis due to pollen: Secondary | ICD-10-CM | POA: Diagnosis not present

## 2022-11-08 DIAGNOSIS — J3089 Other allergic rhinitis: Secondary | ICD-10-CM | POA: Diagnosis not present

## 2022-11-08 DIAGNOSIS — J3081 Allergic rhinitis due to animal (cat) (dog) hair and dander: Secondary | ICD-10-CM | POA: Diagnosis not present

## 2022-11-22 DIAGNOSIS — J301 Allergic rhinitis due to pollen: Secondary | ICD-10-CM | POA: Diagnosis not present

## 2022-11-22 DIAGNOSIS — J3081 Allergic rhinitis due to animal (cat) (dog) hair and dander: Secondary | ICD-10-CM | POA: Diagnosis not present

## 2022-11-22 DIAGNOSIS — J3089 Other allergic rhinitis: Secondary | ICD-10-CM | POA: Diagnosis not present

## 2022-11-23 DIAGNOSIS — N1831 Chronic kidney disease, stage 3a: Secondary | ICD-10-CM | POA: Diagnosis not present

## 2022-11-23 DIAGNOSIS — E7800 Pure hypercholesterolemia, unspecified: Secondary | ICD-10-CM | POA: Diagnosis not present

## 2022-11-23 DIAGNOSIS — E039 Hypothyroidism, unspecified: Secondary | ICD-10-CM | POA: Diagnosis not present

## 2022-11-29 DIAGNOSIS — I10 Essential (primary) hypertension: Secondary | ICD-10-CM | POA: Diagnosis not present

## 2022-11-29 DIAGNOSIS — Z0000 Encounter for general adult medical examination without abnormal findings: Secondary | ICD-10-CM | POA: Diagnosis not present

## 2022-11-29 DIAGNOSIS — N1831 Chronic kidney disease, stage 3a: Secondary | ICD-10-CM | POA: Diagnosis not present

## 2022-11-29 DIAGNOSIS — E7800 Pure hypercholesterolemia, unspecified: Secondary | ICD-10-CM | POA: Diagnosis not present

## 2022-11-29 DIAGNOSIS — E039 Hypothyroidism, unspecified: Secondary | ICD-10-CM | POA: Diagnosis not present

## 2022-12-06 DIAGNOSIS — J3089 Other allergic rhinitis: Secondary | ICD-10-CM | POA: Diagnosis not present

## 2022-12-06 DIAGNOSIS — J301 Allergic rhinitis due to pollen: Secondary | ICD-10-CM | POA: Diagnosis not present

## 2022-12-06 DIAGNOSIS — J3081 Allergic rhinitis due to animal (cat) (dog) hair and dander: Secondary | ICD-10-CM | POA: Diagnosis not present

## 2022-12-21 DIAGNOSIS — J3081 Allergic rhinitis due to animal (cat) (dog) hair and dander: Secondary | ICD-10-CM | POA: Diagnosis not present

## 2022-12-21 DIAGNOSIS — J3089 Other allergic rhinitis: Secondary | ICD-10-CM | POA: Diagnosis not present

## 2022-12-21 DIAGNOSIS — J301 Allergic rhinitis due to pollen: Secondary | ICD-10-CM | POA: Diagnosis not present

## 2022-12-27 DIAGNOSIS — J3081 Allergic rhinitis due to animal (cat) (dog) hair and dander: Secondary | ICD-10-CM | POA: Diagnosis not present

## 2022-12-27 DIAGNOSIS — J301 Allergic rhinitis due to pollen: Secondary | ICD-10-CM | POA: Diagnosis not present

## 2022-12-27 DIAGNOSIS — J3089 Other allergic rhinitis: Secondary | ICD-10-CM | POA: Diagnosis not present

## 2023-01-03 DIAGNOSIS — J3081 Allergic rhinitis due to animal (cat) (dog) hair and dander: Secondary | ICD-10-CM | POA: Diagnosis not present

## 2023-01-03 DIAGNOSIS — J301 Allergic rhinitis due to pollen: Secondary | ICD-10-CM | POA: Diagnosis not present

## 2023-01-03 DIAGNOSIS — J3089 Other allergic rhinitis: Secondary | ICD-10-CM | POA: Diagnosis not present

## 2023-01-10 DIAGNOSIS — J3089 Other allergic rhinitis: Secondary | ICD-10-CM | POA: Diagnosis not present

## 2023-01-10 DIAGNOSIS — J3081 Allergic rhinitis due to animal (cat) (dog) hair and dander: Secondary | ICD-10-CM | POA: Diagnosis not present

## 2023-01-10 DIAGNOSIS — J301 Allergic rhinitis due to pollen: Secondary | ICD-10-CM | POA: Diagnosis not present

## 2023-01-14 DIAGNOSIS — B9689 Other specified bacterial agents as the cause of diseases classified elsewhere: Secondary | ICD-10-CM | POA: Diagnosis not present

## 2023-01-14 DIAGNOSIS — J019 Acute sinusitis, unspecified: Secondary | ICD-10-CM | POA: Diagnosis not present

## 2023-01-14 DIAGNOSIS — R509 Fever, unspecified: Secondary | ICD-10-CM | POA: Diagnosis not present

## 2023-02-05 DIAGNOSIS — J3081 Allergic rhinitis due to animal (cat) (dog) hair and dander: Secondary | ICD-10-CM | POA: Diagnosis not present

## 2023-02-05 DIAGNOSIS — J301 Allergic rhinitis due to pollen: Secondary | ICD-10-CM | POA: Diagnosis not present

## 2023-02-05 DIAGNOSIS — J3089 Other allergic rhinitis: Secondary | ICD-10-CM | POA: Diagnosis not present

## 2023-02-12 DIAGNOSIS — J301 Allergic rhinitis due to pollen: Secondary | ICD-10-CM | POA: Diagnosis not present

## 2023-02-12 DIAGNOSIS — J3081 Allergic rhinitis due to animal (cat) (dog) hair and dander: Secondary | ICD-10-CM | POA: Diagnosis not present

## 2023-02-12 DIAGNOSIS — J3089 Other allergic rhinitis: Secondary | ICD-10-CM | POA: Diagnosis not present

## 2023-02-19 DIAGNOSIS — J3089 Other allergic rhinitis: Secondary | ICD-10-CM | POA: Diagnosis not present

## 2023-02-19 DIAGNOSIS — J301 Allergic rhinitis due to pollen: Secondary | ICD-10-CM | POA: Diagnosis not present

## 2023-02-19 DIAGNOSIS — J3081 Allergic rhinitis due to animal (cat) (dog) hair and dander: Secondary | ICD-10-CM | POA: Diagnosis not present

## 2023-03-07 DIAGNOSIS — J301 Allergic rhinitis due to pollen: Secondary | ICD-10-CM | POA: Diagnosis not present

## 2023-03-07 DIAGNOSIS — J3081 Allergic rhinitis due to animal (cat) (dog) hair and dander: Secondary | ICD-10-CM | POA: Diagnosis not present

## 2023-03-07 DIAGNOSIS — J3089 Other allergic rhinitis: Secondary | ICD-10-CM | POA: Diagnosis not present

## 2023-03-21 DIAGNOSIS — J3081 Allergic rhinitis due to animal (cat) (dog) hair and dander: Secondary | ICD-10-CM | POA: Diagnosis not present

## 2023-03-21 DIAGNOSIS — J301 Allergic rhinitis due to pollen: Secondary | ICD-10-CM | POA: Diagnosis not present

## 2023-03-21 DIAGNOSIS — J3089 Other allergic rhinitis: Secondary | ICD-10-CM | POA: Diagnosis not present

## 2023-04-05 DIAGNOSIS — J3089 Other allergic rhinitis: Secondary | ICD-10-CM | POA: Diagnosis not present

## 2023-04-05 DIAGNOSIS — J3081 Allergic rhinitis due to animal (cat) (dog) hair and dander: Secondary | ICD-10-CM | POA: Diagnosis not present

## 2023-04-05 DIAGNOSIS — J301 Allergic rhinitis due to pollen: Secondary | ICD-10-CM | POA: Diagnosis not present

## 2023-04-08 DIAGNOSIS — L820 Inflamed seborrheic keratosis: Secondary | ICD-10-CM | POA: Diagnosis not present

## 2023-04-08 DIAGNOSIS — L853 Xerosis cutis: Secondary | ICD-10-CM | POA: Diagnosis not present

## 2023-04-08 DIAGNOSIS — D229 Melanocytic nevi, unspecified: Secondary | ICD-10-CM | POA: Diagnosis not present

## 2023-04-08 DIAGNOSIS — L100 Pemphigus vulgaris: Secondary | ICD-10-CM | POA: Diagnosis not present

## 2023-04-08 DIAGNOSIS — L821 Other seborrheic keratosis: Secondary | ICD-10-CM | POA: Diagnosis not present

## 2023-04-08 DIAGNOSIS — L814 Other melanin hyperpigmentation: Secondary | ICD-10-CM | POA: Diagnosis not present

## 2023-04-08 DIAGNOSIS — L578 Other skin changes due to chronic exposure to nonionizing radiation: Secondary | ICD-10-CM | POA: Diagnosis not present

## 2023-04-18 DIAGNOSIS — J3089 Other allergic rhinitis: Secondary | ICD-10-CM | POA: Diagnosis not present

## 2023-04-18 DIAGNOSIS — J301 Allergic rhinitis due to pollen: Secondary | ICD-10-CM | POA: Diagnosis not present

## 2023-04-18 DIAGNOSIS — J3081 Allergic rhinitis due to animal (cat) (dog) hair and dander: Secondary | ICD-10-CM | POA: Diagnosis not present

## 2023-05-02 DIAGNOSIS — H2513 Age-related nuclear cataract, bilateral: Secondary | ICD-10-CM | POA: Diagnosis not present

## 2023-05-02 DIAGNOSIS — H04123 Dry eye syndrome of bilateral lacrimal glands: Secondary | ICD-10-CM | POA: Diagnosis not present

## 2023-05-02 DIAGNOSIS — H40003 Preglaucoma, unspecified, bilateral: Secondary | ICD-10-CM | POA: Diagnosis not present

## 2023-05-02 DIAGNOSIS — J3081 Allergic rhinitis due to animal (cat) (dog) hair and dander: Secondary | ICD-10-CM | POA: Diagnosis not present

## 2023-05-02 DIAGNOSIS — J3089 Other allergic rhinitis: Secondary | ICD-10-CM | POA: Diagnosis not present

## 2023-05-02 DIAGNOSIS — H538 Other visual disturbances: Secondary | ICD-10-CM | POA: Diagnosis not present

## 2023-05-02 DIAGNOSIS — J301 Allergic rhinitis due to pollen: Secondary | ICD-10-CM | POA: Diagnosis not present

## 2023-05-16 DIAGNOSIS — J3081 Allergic rhinitis due to animal (cat) (dog) hair and dander: Secondary | ICD-10-CM | POA: Diagnosis not present

## 2023-05-16 DIAGNOSIS — J3089 Other allergic rhinitis: Secondary | ICD-10-CM | POA: Diagnosis not present

## 2023-05-16 DIAGNOSIS — J301 Allergic rhinitis due to pollen: Secondary | ICD-10-CM | POA: Diagnosis not present

## 2023-05-24 DIAGNOSIS — J301 Allergic rhinitis due to pollen: Secondary | ICD-10-CM | POA: Diagnosis not present

## 2023-05-24 DIAGNOSIS — J3089 Other allergic rhinitis: Secondary | ICD-10-CM | POA: Diagnosis not present

## 2023-05-24 DIAGNOSIS — J3081 Allergic rhinitis due to animal (cat) (dog) hair and dander: Secondary | ICD-10-CM | POA: Diagnosis not present

## 2023-05-24 DIAGNOSIS — J4520 Mild intermittent asthma, uncomplicated: Secondary | ICD-10-CM | POA: Diagnosis not present

## 2023-05-30 DIAGNOSIS — E7800 Pure hypercholesterolemia, unspecified: Secondary | ICD-10-CM | POA: Diagnosis not present

## 2023-05-30 DIAGNOSIS — E039 Hypothyroidism, unspecified: Secondary | ICD-10-CM | POA: Diagnosis not present

## 2023-05-30 DIAGNOSIS — J301 Allergic rhinitis due to pollen: Secondary | ICD-10-CM | POA: Diagnosis not present

## 2023-05-30 DIAGNOSIS — J3081 Allergic rhinitis due to animal (cat) (dog) hair and dander: Secondary | ICD-10-CM | POA: Diagnosis not present

## 2023-05-30 DIAGNOSIS — J3089 Other allergic rhinitis: Secondary | ICD-10-CM | POA: Diagnosis not present

## 2023-06-06 DIAGNOSIS — I10 Essential (primary) hypertension: Secondary | ICD-10-CM | POA: Diagnosis not present

## 2023-06-06 DIAGNOSIS — E039 Hypothyroidism, unspecified: Secondary | ICD-10-CM | POA: Diagnosis not present

## 2023-06-06 DIAGNOSIS — E7800 Pure hypercholesterolemia, unspecified: Secondary | ICD-10-CM | POA: Diagnosis not present

## 2023-06-06 DIAGNOSIS — N1831 Chronic kidney disease, stage 3a: Secondary | ICD-10-CM | POA: Diagnosis not present

## 2023-06-06 DIAGNOSIS — Z9189 Other specified personal risk factors, not elsewhere classified: Secondary | ICD-10-CM | POA: Diagnosis not present

## 2023-06-10 ENCOUNTER — Other Ambulatory Visit: Payer: Self-pay | Admitting: Family Medicine

## 2023-06-10 DIAGNOSIS — E7800 Pure hypercholesterolemia, unspecified: Secondary | ICD-10-CM

## 2023-06-10 DIAGNOSIS — Z9189 Other specified personal risk factors, not elsewhere classified: Secondary | ICD-10-CM

## 2023-06-13 DIAGNOSIS — J3089 Other allergic rhinitis: Secondary | ICD-10-CM | POA: Diagnosis not present

## 2023-06-13 DIAGNOSIS — J3081 Allergic rhinitis due to animal (cat) (dog) hair and dander: Secondary | ICD-10-CM | POA: Diagnosis not present

## 2023-06-13 DIAGNOSIS — J301 Allergic rhinitis due to pollen: Secondary | ICD-10-CM | POA: Diagnosis not present

## 2023-06-19 ENCOUNTER — Ambulatory Visit
Admission: RE | Admit: 2023-06-19 | Discharge: 2023-06-19 | Disposition: A | Discharge status: Home / Self Care | Payer: Self-pay | Source: Ambulatory Visit | Attending: Family Medicine | Admitting: Family Medicine

## 2023-06-19 DIAGNOSIS — E7800 Pure hypercholesterolemia, unspecified: Secondary | ICD-10-CM | POA: Insufficient documentation

## 2023-06-19 DIAGNOSIS — Z9189 Other specified personal risk factors, not elsewhere classified: Secondary | ICD-10-CM | POA: Insufficient documentation

## 2023-06-27 DIAGNOSIS — J3081 Allergic rhinitis due to animal (cat) (dog) hair and dander: Secondary | ICD-10-CM | POA: Diagnosis not present

## 2023-06-27 DIAGNOSIS — J3089 Other allergic rhinitis: Secondary | ICD-10-CM | POA: Diagnosis not present

## 2023-06-27 DIAGNOSIS — J301 Allergic rhinitis due to pollen: Secondary | ICD-10-CM | POA: Diagnosis not present

## 2023-07-05 DIAGNOSIS — I10 Essential (primary) hypertension: Secondary | ICD-10-CM | POA: Diagnosis not present

## 2023-07-05 DIAGNOSIS — I2510 Atherosclerotic heart disease of native coronary artery without angina pectoris: Secondary | ICD-10-CM | POA: Diagnosis not present

## 2023-07-05 DIAGNOSIS — E782 Mixed hyperlipidemia: Secondary | ICD-10-CM | POA: Diagnosis not present

## 2023-07-17 DIAGNOSIS — I2510 Atherosclerotic heart disease of native coronary artery without angina pectoris: Secondary | ICD-10-CM | POA: Diagnosis not present

## 2023-07-18 DIAGNOSIS — J3081 Allergic rhinitis due to animal (cat) (dog) hair and dander: Secondary | ICD-10-CM | POA: Diagnosis not present

## 2023-07-18 DIAGNOSIS — J3089 Other allergic rhinitis: Secondary | ICD-10-CM | POA: Diagnosis not present

## 2023-07-18 DIAGNOSIS — J301 Allergic rhinitis due to pollen: Secondary | ICD-10-CM | POA: Diagnosis not present

## 2023-07-25 DIAGNOSIS — J3089 Other allergic rhinitis: Secondary | ICD-10-CM | POA: Diagnosis not present

## 2023-07-25 DIAGNOSIS — J3081 Allergic rhinitis due to animal (cat) (dog) hair and dander: Secondary | ICD-10-CM | POA: Diagnosis not present

## 2023-07-25 DIAGNOSIS — J301 Allergic rhinitis due to pollen: Secondary | ICD-10-CM | POA: Diagnosis not present

## 2023-08-01 ENCOUNTER — Other Ambulatory Visit: Payer: Self-pay | Admitting: Family Medicine

## 2023-08-01 DIAGNOSIS — Z1231 Encounter for screening mammogram for malignant neoplasm of breast: Secondary | ICD-10-CM

## 2023-08-01 DIAGNOSIS — J3089 Other allergic rhinitis: Secondary | ICD-10-CM | POA: Diagnosis not present

## 2023-08-01 DIAGNOSIS — J3081 Allergic rhinitis due to animal (cat) (dog) hair and dander: Secondary | ICD-10-CM | POA: Diagnosis not present

## 2023-08-01 DIAGNOSIS — J301 Allergic rhinitis due to pollen: Secondary | ICD-10-CM | POA: Diagnosis not present

## 2023-08-02 DIAGNOSIS — I10 Essential (primary) hypertension: Secondary | ICD-10-CM | POA: Diagnosis not present

## 2023-08-02 DIAGNOSIS — I2510 Atherosclerotic heart disease of native coronary artery without angina pectoris: Secondary | ICD-10-CM | POA: Diagnosis not present

## 2023-08-02 DIAGNOSIS — E782 Mixed hyperlipidemia: Secondary | ICD-10-CM | POA: Diagnosis not present

## 2023-08-08 DIAGNOSIS — J301 Allergic rhinitis due to pollen: Secondary | ICD-10-CM | POA: Diagnosis not present

## 2023-08-08 DIAGNOSIS — J3089 Other allergic rhinitis: Secondary | ICD-10-CM | POA: Diagnosis not present

## 2023-08-08 DIAGNOSIS — J3081 Allergic rhinitis due to animal (cat) (dog) hair and dander: Secondary | ICD-10-CM | POA: Diagnosis not present

## 2023-08-15 DIAGNOSIS — J301 Allergic rhinitis due to pollen: Secondary | ICD-10-CM | POA: Diagnosis not present

## 2023-08-15 DIAGNOSIS — J3081 Allergic rhinitis due to animal (cat) (dog) hair and dander: Secondary | ICD-10-CM | POA: Diagnosis not present

## 2023-08-15 DIAGNOSIS — J3089 Other allergic rhinitis: Secondary | ICD-10-CM | POA: Diagnosis not present

## 2023-08-22 DIAGNOSIS — J3081 Allergic rhinitis due to animal (cat) (dog) hair and dander: Secondary | ICD-10-CM | POA: Diagnosis not present

## 2023-08-22 DIAGNOSIS — J3089 Other allergic rhinitis: Secondary | ICD-10-CM | POA: Diagnosis not present

## 2023-08-22 DIAGNOSIS — J301 Allergic rhinitis due to pollen: Secondary | ICD-10-CM | POA: Diagnosis not present

## 2023-08-29 DIAGNOSIS — J3081 Allergic rhinitis due to animal (cat) (dog) hair and dander: Secondary | ICD-10-CM | POA: Diagnosis not present

## 2023-08-29 DIAGNOSIS — J301 Allergic rhinitis due to pollen: Secondary | ICD-10-CM | POA: Diagnosis not present

## 2023-08-29 DIAGNOSIS — J3089 Other allergic rhinitis: Secondary | ICD-10-CM | POA: Diagnosis not present

## 2023-09-05 ENCOUNTER — Ambulatory Visit
Admission: RE | Admit: 2023-09-05 | Discharge: 2023-09-05 | Disposition: A | Discharge status: Home / Self Care | Source: Ambulatory Visit | Attending: Family Medicine | Admitting: Family Medicine

## 2023-09-05 DIAGNOSIS — Z1231 Encounter for screening mammogram for malignant neoplasm of breast: Secondary | ICD-10-CM | POA: Diagnosis not present

## 2023-09-12 DIAGNOSIS — J3089 Other allergic rhinitis: Secondary | ICD-10-CM | POA: Diagnosis not present

## 2023-09-12 DIAGNOSIS — J3081 Allergic rhinitis due to animal (cat) (dog) hair and dander: Secondary | ICD-10-CM | POA: Diagnosis not present

## 2023-09-12 DIAGNOSIS — J301 Allergic rhinitis due to pollen: Secondary | ICD-10-CM | POA: Diagnosis not present

## 2023-09-26 DIAGNOSIS — J301 Allergic rhinitis due to pollen: Secondary | ICD-10-CM | POA: Diagnosis not present

## 2023-09-26 DIAGNOSIS — J3089 Other allergic rhinitis: Secondary | ICD-10-CM | POA: Diagnosis not present

## 2023-09-26 DIAGNOSIS — J3081 Allergic rhinitis due to animal (cat) (dog) hair and dander: Secondary | ICD-10-CM | POA: Diagnosis not present

## 2023-10-10 DIAGNOSIS — J301 Allergic rhinitis due to pollen: Secondary | ICD-10-CM | POA: Diagnosis not present

## 2023-10-10 DIAGNOSIS — J3089 Other allergic rhinitis: Secondary | ICD-10-CM | POA: Diagnosis not present

## 2023-10-10 DIAGNOSIS — J3081 Allergic rhinitis due to animal (cat) (dog) hair and dander: Secondary | ICD-10-CM | POA: Diagnosis not present

## 2023-10-24 DIAGNOSIS — J3089 Other allergic rhinitis: Secondary | ICD-10-CM | POA: Diagnosis not present

## 2023-10-24 DIAGNOSIS — J3081 Allergic rhinitis due to animal (cat) (dog) hair and dander: Secondary | ICD-10-CM | POA: Diagnosis not present

## 2023-10-24 DIAGNOSIS — J301 Allergic rhinitis due to pollen: Secondary | ICD-10-CM | POA: Diagnosis not present

## 2023-10-31 DIAGNOSIS — J301 Allergic rhinitis due to pollen: Secondary | ICD-10-CM | POA: Diagnosis not present

## 2023-10-31 DIAGNOSIS — J3081 Allergic rhinitis due to animal (cat) (dog) hair and dander: Secondary | ICD-10-CM | POA: Diagnosis not present

## 2023-10-31 DIAGNOSIS — J3089 Other allergic rhinitis: Secondary | ICD-10-CM | POA: Diagnosis not present

## 2023-11-02 DIAGNOSIS — R0789 Other chest pain: Secondary | ICD-10-CM | POA: Diagnosis not present

## 2023-11-02 DIAGNOSIS — N183 Chronic kidney disease, stage 3 unspecified: Secondary | ICD-10-CM | POA: Diagnosis not present

## 2023-11-02 DIAGNOSIS — E785 Hyperlipidemia, unspecified: Secondary | ICD-10-CM | POA: Diagnosis not present

## 2023-11-02 DIAGNOSIS — J45909 Unspecified asthma, uncomplicated: Secondary | ICD-10-CM | POA: Diagnosis not present

## 2023-11-02 DIAGNOSIS — R079 Chest pain, unspecified: Secondary | ICD-10-CM | POA: Diagnosis not present

## 2023-11-02 DIAGNOSIS — I129 Hypertensive chronic kidney disease with stage 1 through stage 4 chronic kidney disease, or unspecified chronic kidney disease: Secondary | ICD-10-CM | POA: Diagnosis not present

## 2023-11-02 DIAGNOSIS — I2510 Atherosclerotic heart disease of native coronary artery without angina pectoris: Secondary | ICD-10-CM | POA: Diagnosis not present

## 2023-11-02 DIAGNOSIS — Z79899 Other long term (current) drug therapy: Secondary | ICD-10-CM | POA: Diagnosis not present

## 2023-11-07 DIAGNOSIS — J301 Allergic rhinitis due to pollen: Secondary | ICD-10-CM | POA: Diagnosis not present

## 2023-11-07 DIAGNOSIS — J3081 Allergic rhinitis due to animal (cat) (dog) hair and dander: Secondary | ICD-10-CM | POA: Diagnosis not present

## 2023-11-07 DIAGNOSIS — J3089 Other allergic rhinitis: Secondary | ICD-10-CM | POA: Diagnosis not present

## 2023-11-21 DIAGNOSIS — J301 Allergic rhinitis due to pollen: Secondary | ICD-10-CM | POA: Diagnosis not present

## 2023-11-21 DIAGNOSIS — J3089 Other allergic rhinitis: Secondary | ICD-10-CM | POA: Diagnosis not present

## 2023-11-21 DIAGNOSIS — J3081 Allergic rhinitis due to animal (cat) (dog) hair and dander: Secondary | ICD-10-CM | POA: Diagnosis not present

## 2023-11-26 DIAGNOSIS — E7800 Pure hypercholesterolemia, unspecified: Secondary | ICD-10-CM | POA: Diagnosis not present

## 2023-11-26 DIAGNOSIS — E039 Hypothyroidism, unspecified: Secondary | ICD-10-CM | POA: Diagnosis not present

## 2023-12-10 DIAGNOSIS — I10 Essential (primary) hypertension: Secondary | ICD-10-CM | POA: Diagnosis not present

## 2023-12-10 DIAGNOSIS — Z1331 Encounter for screening for depression: Secondary | ICD-10-CM | POA: Diagnosis not present

## 2023-12-10 DIAGNOSIS — I2510 Atherosclerotic heart disease of native coronary artery without angina pectoris: Secondary | ICD-10-CM | POA: Diagnosis not present

## 2023-12-10 DIAGNOSIS — Z0000 Encounter for general adult medical examination without abnormal findings: Secondary | ICD-10-CM | POA: Diagnosis not present

## 2023-12-10 DIAGNOSIS — E7800 Pure hypercholesterolemia, unspecified: Secondary | ICD-10-CM | POA: Diagnosis not present

## 2023-12-10 DIAGNOSIS — N1831 Chronic kidney disease, stage 3a: Secondary | ICD-10-CM | POA: Diagnosis not present

## 2023-12-10 DIAGNOSIS — E039 Hypothyroidism, unspecified: Secondary | ICD-10-CM | POA: Diagnosis not present

## 2023-12-12 DIAGNOSIS — J301 Allergic rhinitis due to pollen: Secondary | ICD-10-CM | POA: Diagnosis not present

## 2023-12-12 DIAGNOSIS — J3081 Allergic rhinitis due to animal (cat) (dog) hair and dander: Secondary | ICD-10-CM | POA: Diagnosis not present

## 2023-12-12 DIAGNOSIS — J3089 Other allergic rhinitis: Secondary | ICD-10-CM | POA: Diagnosis not present

## 2023-12-26 DIAGNOSIS — J301 Allergic rhinitis due to pollen: Secondary | ICD-10-CM | POA: Diagnosis not present

## 2023-12-26 DIAGNOSIS — J3081 Allergic rhinitis due to animal (cat) (dog) hair and dander: Secondary | ICD-10-CM | POA: Diagnosis not present

## 2023-12-26 DIAGNOSIS — J3089 Other allergic rhinitis: Secondary | ICD-10-CM | POA: Diagnosis not present

## 2024-01-09 DIAGNOSIS — J3081 Allergic rhinitis due to animal (cat) (dog) hair and dander: Secondary | ICD-10-CM | POA: Diagnosis not present

## 2024-01-09 DIAGNOSIS — J301 Allergic rhinitis due to pollen: Secondary | ICD-10-CM | POA: Diagnosis not present

## 2024-01-09 DIAGNOSIS — J3089 Other allergic rhinitis: Secondary | ICD-10-CM | POA: Diagnosis not present

## 2024-01-23 DIAGNOSIS — J3089 Other allergic rhinitis: Secondary | ICD-10-CM | POA: Diagnosis not present

## 2024-01-23 DIAGNOSIS — J301 Allergic rhinitis due to pollen: Secondary | ICD-10-CM | POA: Diagnosis not present

## 2024-01-23 DIAGNOSIS — J3081 Allergic rhinitis due to animal (cat) (dog) hair and dander: Secondary | ICD-10-CM | POA: Diagnosis not present

## 2024-02-04 DIAGNOSIS — I2510 Atherosclerotic heart disease of native coronary artery without angina pectoris: Secondary | ICD-10-CM | POA: Diagnosis not present

## 2024-02-04 DIAGNOSIS — E782 Mixed hyperlipidemia: Secondary | ICD-10-CM | POA: Diagnosis not present

## 2024-02-04 DIAGNOSIS — I10 Essential (primary) hypertension: Secondary | ICD-10-CM | POA: Diagnosis not present

## 2024-02-06 DIAGNOSIS — J301 Allergic rhinitis due to pollen: Secondary | ICD-10-CM | POA: Diagnosis not present

## 2024-02-06 DIAGNOSIS — J3081 Allergic rhinitis due to animal (cat) (dog) hair and dander: Secondary | ICD-10-CM | POA: Diagnosis not present

## 2024-02-06 DIAGNOSIS — J3089 Other allergic rhinitis: Secondary | ICD-10-CM | POA: Diagnosis not present

## 2024-02-20 DIAGNOSIS — J3089 Other allergic rhinitis: Secondary | ICD-10-CM | POA: Diagnosis not present

## 2024-02-20 DIAGNOSIS — J3081 Allergic rhinitis due to animal (cat) (dog) hair and dander: Secondary | ICD-10-CM | POA: Diagnosis not present

## 2024-02-20 DIAGNOSIS — J301 Allergic rhinitis due to pollen: Secondary | ICD-10-CM | POA: Diagnosis not present

## 2024-03-05 DIAGNOSIS — J3081 Allergic rhinitis due to animal (cat) (dog) hair and dander: Secondary | ICD-10-CM | POA: Diagnosis not present

## 2024-03-05 DIAGNOSIS — J301 Allergic rhinitis due to pollen: Secondary | ICD-10-CM | POA: Diagnosis not present

## 2024-03-05 DIAGNOSIS — E7800 Pure hypercholesterolemia, unspecified: Secondary | ICD-10-CM | POA: Diagnosis not present

## 2024-03-05 DIAGNOSIS — J3089 Other allergic rhinitis: Secondary | ICD-10-CM | POA: Diagnosis not present

## 2024-03-05 DIAGNOSIS — E039 Hypothyroidism, unspecified: Secondary | ICD-10-CM | POA: Diagnosis not present

## 2024-03-06 DIAGNOSIS — H2513 Age-related nuclear cataract, bilateral: Secondary | ICD-10-CM | POA: Diagnosis not present

## 2024-03-06 DIAGNOSIS — H40003 Preglaucoma, unspecified, bilateral: Secondary | ICD-10-CM | POA: Diagnosis not present

## 2024-03-06 DIAGNOSIS — H538 Other visual disturbances: Secondary | ICD-10-CM | POA: Diagnosis not present

## 2024-03-06 DIAGNOSIS — H25012 Cortical age-related cataract, left eye: Secondary | ICD-10-CM | POA: Diagnosis not present

## 2024-03-11 DIAGNOSIS — E7800 Pure hypercholesterolemia, unspecified: Secondary | ICD-10-CM | POA: Diagnosis not present

## 2024-03-11 DIAGNOSIS — I10 Essential (primary) hypertension: Secondary | ICD-10-CM | POA: Diagnosis not present

## 2024-03-11 DIAGNOSIS — E039 Hypothyroidism, unspecified: Secondary | ICD-10-CM | POA: Diagnosis not present

## 2024-03-11 DIAGNOSIS — N1831 Chronic kidney disease, stage 3a: Secondary | ICD-10-CM | POA: Diagnosis not present

## 2024-03-12 DIAGNOSIS — J301 Allergic rhinitis due to pollen: Secondary | ICD-10-CM | POA: Diagnosis not present

## 2024-03-12 DIAGNOSIS — J3081 Allergic rhinitis due to animal (cat) (dog) hair and dander: Secondary | ICD-10-CM | POA: Diagnosis not present

## 2024-03-18 DIAGNOSIS — H2512 Age-related nuclear cataract, left eye: Secondary | ICD-10-CM | POA: Diagnosis not present

## 2024-03-18 DIAGNOSIS — H2513 Age-related nuclear cataract, bilateral: Secondary | ICD-10-CM | POA: Diagnosis not present

## 2024-03-19 DIAGNOSIS — J301 Allergic rhinitis due to pollen: Secondary | ICD-10-CM | POA: Diagnosis not present

## 2024-03-19 DIAGNOSIS — J3081 Allergic rhinitis due to animal (cat) (dog) hair and dander: Secondary | ICD-10-CM | POA: Diagnosis not present

## 2024-03-19 DIAGNOSIS — J3089 Other allergic rhinitis: Secondary | ICD-10-CM | POA: Diagnosis not present

## 2024-03-25 ENCOUNTER — Encounter: Payer: Self-pay | Admitting: Ophthalmology

## 2024-03-25 NOTE — Anesthesia Preprocedure Evaluation (Addendum)
 Anesthesia Evaluation  Patient identified by MRN, date of birth, ID band Patient awake    Reviewed: Allergy & Precautions, H&P , NPO status , Patient's Chart, lab work & pertinent test results  Airway Mallampati: III  TM Distance: <3 FB Neck ROM: Full    Dental no notable dental hx.  Teeth very crooked, none loose, none chipped, has two crowns, but none in front, not sure exact placement of crowns:   Pulmonary neg pulmonary ROS   Pulmonary exam normal breath sounds clear to auscultation       Cardiovascular hypertension, negative cardio ROS Normal cardiovascular exam Rhythm:Regular Rate:Normal  09-17-17 cath   The left ventricular systolic function is normal.  LV end diastolic pressure is normal.  The left ventricular ejection fraction is 55-65% by visual estimate.  There is no mitral valve regurgitation.  There is no aortic valve stenosis.  There is no mitral valve stenosis and no mitral valve prolapse evident.   Normal lv function Normal coronary arteries. Risk factor optimization    Neuro/Psych negative neurological ROS  negative psych ROS   GI/Hepatic negative GI ROS, Neg liver ROS,,,  Endo/Other  negative endocrine ROS    Renal/GU negative Renal ROS  negative genitourinary   Musculoskeletal negative musculoskeletal ROS (+)    Abdominal   Peds negative pediatric ROS (+)  Hematology negative hematology ROS (+)   Anesthesia Other Findings Hypothyroidism  Arthritis Hypertension Fuchs' corneal dystrophy History of shingles  Hypercholesterolemia Coronary artery disease  Allergic rhinitis    Reproductive/Obstetrics negative OB ROS                              Anesthesia Physical Anesthesia Plan  ASA: 2  Anesthesia Plan: MAC   Post-op Pain Management:    Induction: Intravenous  PONV Risk Score and Plan:   Airway Management Planned: Natural Airway and Nasal  Cannula  Additional Equipment:   Intra-op Plan:   Post-operative Plan:   Informed Consent: I have reviewed the patients History and Physical, chart, labs and discussed the procedure including the risks, benefits and alternatives for the proposed anesthesia with the patient or authorized representative who has indicated his/her understanding and acceptance.     Dental Advisory Given  Plan Discussed with: Anesthesiologist, CRNA and Surgeon  Anesthesia Plan Comments: (Patient consented for risks of anesthesia including but not limited to:  - adverse reactions to medications - damage to eyes, teeth, lips or other oral mucosa - nerve damage due to positioning  - sore throat or hoarseness - Damage to heart, brain, nerves, lungs, other parts of body or loss of life  Patient voiced understanding and assent.)         Anesthesia Quick Evaluation

## 2024-03-27 NOTE — Discharge Instructions (Signed)

## 2024-03-30 ENCOUNTER — Encounter
Admission: RE | Disposition: A | Discharge status: Home / Self Care | Payer: Self-pay | Source: Home / Self Care | Attending: Ophthalmology

## 2024-03-30 ENCOUNTER — Encounter: Payer: Self-pay | Admitting: Ophthalmology

## 2024-03-30 ENCOUNTER — Ambulatory Visit
Admission: RE | Admit: 2024-03-30 | Discharge: 2024-03-30 | Disposition: A | Discharge status: Home / Self Care | Attending: Ophthalmology | Admitting: Ophthalmology

## 2024-03-30 ENCOUNTER — Ambulatory Visit: Payer: Self-pay | Admitting: Anesthesiology

## 2024-03-30 ENCOUNTER — Other Ambulatory Visit: Payer: Self-pay

## 2024-03-30 DIAGNOSIS — I10 Essential (primary) hypertension: Secondary | ICD-10-CM | POA: Diagnosis not present

## 2024-03-30 DIAGNOSIS — H25012 Cortical age-related cataract, left eye: Secondary | ICD-10-CM | POA: Diagnosis not present

## 2024-03-30 DIAGNOSIS — E039 Hypothyroidism, unspecified: Secondary | ICD-10-CM | POA: Diagnosis not present

## 2024-03-30 DIAGNOSIS — H2512 Age-related nuclear cataract, left eye: Secondary | ICD-10-CM | POA: Diagnosis not present

## 2024-03-30 DIAGNOSIS — I2510 Atherosclerotic heart disease of native coronary artery without angina pectoris: Secondary | ICD-10-CM | POA: Diagnosis not present

## 2024-03-30 DIAGNOSIS — E7800 Pure hypercholesterolemia, unspecified: Secondary | ICD-10-CM | POA: Diagnosis not present

## 2024-03-30 HISTORY — PX: CATARACT EXTRACTION W/PHACO: SHX586

## 2024-03-30 HISTORY — DX: Atherosclerotic heart disease of native coronary artery without angina pectoris: I25.10

## 2024-03-30 HISTORY — DX: Allergic rhinitis, unspecified: J30.9

## 2024-03-30 SURGERY — PHACOEMULSIFICATION, CATARACT, WITH IOL INSERTION
Anesthesia: Monitor Anesthesia Care | Laterality: Left

## 2024-03-30 MED ORDER — CYCLOPENTOLATE HCL 2 % OP SOLN
1.0000 [drp] | OPHTHALMIC | Status: DC | PRN
  Administered 2024-03-30 (×3): 1 [drp] via OPHTHALMIC

## 2024-03-30 MED ORDER — PHENYLEPHRINE HCL 10 % OP SOLN
1.0000 [drp] | OPHTHALMIC | Status: DC | PRN
  Administered 2024-03-30 (×3): 1 [drp] via OPHTHALMIC

## 2024-03-30 MED ORDER — TETRACAINE HCL 0.5 % OP SOLN
OPHTHALMIC | Status: AC
  Filled 2024-03-30: qty 4

## 2024-03-30 MED ORDER — FENTANYL CITRATE (PF) 100 MCG/2ML IJ SOLN
INTRAMUSCULAR | Status: DC | PRN
  Administered 2024-03-30: 50 ug via INTRAVENOUS

## 2024-03-30 MED ORDER — PHENYLEPHRINE HCL 10 % OP SOLN
OPHTHALMIC | Status: AC
Start: 2024-03-30 — End: 2024-03-30
  Filled 2024-03-30: qty 5

## 2024-03-30 MED ORDER — MIDAZOLAM HCL 2 MG/2ML IJ SOLN
INTRAMUSCULAR | Status: AC
  Filled 2024-03-30: qty 2

## 2024-03-30 MED ORDER — MIDAZOLAM HCL (PF) 2 MG/2ML IJ SOLN
INTRAMUSCULAR | Status: DC | PRN
  Administered 2024-03-30: 2 mg via INTRAVENOUS

## 2024-03-30 MED ORDER — MOXIFLOXACIN HCL 0.5 % OP SOLN
OPHTHALMIC | Status: DC | PRN
Start: 2024-03-30 — End: 2024-03-30
  Administered 2024-03-30: .2 mL via OPHTHALMIC

## 2024-03-30 MED ORDER — LACTATED RINGERS IV SOLN: INTRAVENOUS | Status: DC

## 2024-03-30 MED ORDER — EPINEPHRINE PF 1 MG/ML IJ SOLN
INTRAMUSCULAR | Status: DC | PRN
  Administered 2024-03-30: 64 mL via OPHTHALMIC

## 2024-03-30 MED ORDER — FENTANYL CITRATE (PF) 100 MCG/2ML IJ SOLN
INTRAMUSCULAR | Status: AC
  Filled 2024-03-30: qty 2

## 2024-03-30 MED ORDER — TETRACAINE HCL 0.5 % OP SOLN
1.0000 [drp] | OPHTHALMIC | Status: DC | PRN
  Administered 2024-03-30 (×2): 1 [drp] via OPHTHALMIC

## 2024-03-30 MED ORDER — SIGHTPATH DOSE#1 BSS IO SOLN
INTRAOCULAR | Status: DC | PRN
  Administered 2024-03-30: 15 mL via INTRAOCULAR

## 2024-03-30 MED ORDER — LIDOCAINE HCL (PF) 2 % IJ SOLN
INTRAOCULAR | Status: DC | PRN
  Administered 2024-03-30: 1 mL via INTRAOCULAR

## 2024-03-30 MED ORDER — CYCLOPENTOLATE HCL 2 % OP SOLN
OPHTHALMIC | Status: AC
  Filled 2024-03-30: qty 2

## 2024-03-30 MED ORDER — SIGHTPATH DOSE#1 NA HYALUR & NA CHOND-NA HYALUR IO KIT
PACK | INTRAOCULAR | Status: DC | PRN
  Administered 2024-03-30: 1 via OPHTHALMIC

## 2024-03-30 SURGICAL SUPPLY — 9 items
DISSECTOR HYDRO NUCLEUS 50X22 (MISCELLANEOUS) ×1 IMPLANT
FEE CATARACT SUITE SIGHTPATH (MISCELLANEOUS) ×1 IMPLANT
GLOVE PI ULTRA LF STRL 7.5 (GLOVE) ×1 IMPLANT
GLOVE SURG SYN 6.5 PF PI BL (GLOVE) ×1 IMPLANT
GLOVE SURG SYN 8.5 PF PI BL (GLOVE) ×1 IMPLANT
LENS IOL TECNIS EYHANCE 15.0 (Intraocular Lens) IMPLANT
NDL FILTER BLUNT 18X1 1/2 (NEEDLE) ×1 IMPLANT
NEEDLE FILTER BLUNT 18X1 1/2 (NEEDLE) ×1 IMPLANT
SYR 3ML LL SCALE MARK (SYRINGE) ×1 IMPLANT

## 2024-03-30 NOTE — H&P (Signed)
 [facility]   Primary Care Physician:  Alla Amis, MD Ophthalmologist: Dr. Adine Novak  Pre-Procedure History & Physical: HPI:  Terri Jefferson is a 73 y.o. female here for cataract surgery.   Past Medical History:  Diagnosis Date   Allergic rhinitis    Arthritis    Coronary artery disease    Fuchs' corneal dystrophy    History of shingles    Hypercholesterolemia    Hypertension    Hypothyroidism     Past Surgical History:  Procedure Laterality Date   COLONOSCOPY WITH PROPOFOL  N/A 10/05/2019   Procedure: COLONOSCOPY WITH PROPOFOL ;  Surgeon: Toledo, Ladell POUR, MD;  Location: [facility] ENDOSCOPY;  Service: Gastroenterology;  Laterality: N/A;   LEFT HEART CATH AND CORONARY ANGIOGRAPHY Left 09/17/2017   Procedure: LEFT HEART CATH AND CORONARY ANGIOGRAPHY;  Surgeon: Bosie Vinie LABOR, MD;  Location: [facility] INVASIVE CV LAB;  Service: Cardiovascular;  Laterality: Left;   THYROIDECTOMY  1985   TONSILLECTOMY      Prior to Admission medications   Medication Sig Start Date End Date Taking? Authorizing Provider  atorvastatin (LIPITOR) 10 MG tablet Take 10 mg by mouth daily.   Yes [provider]  Calcium Carbonate-Vitamin D (CALTRATE 600+D PO) Take by mouth daily.   Yes [provider]  levothyroxine (SYNTHROID) 75 MCG tablet Take 75 mcg by mouth daily before breakfast. One tablet Monday through Friday   Yes [provider]  lisinopril-hydrochlorothiazide (PRINZIDE,ZESTORETIC) 10-12.5 MG tablet Take 1 tablet by mouth daily.   Yes [provider]  loratadine (ALAVERT) 10 MG tablet Take 10 mg by mouth daily.   Yes [provider]  metoprolol succinate (TOPROL-XL) 25 MG 24 hr tablet Take 25 mg by mouth daily.   Yes [provider]  Multiple Vitamins-Minerals (MULTIVITAMIN PO) Take 1 tablet by mouth daily.   Yes [provider]  Probiotic Product (PROBIOTIC DAILY PO) Take 1 tablet by mouth daily.   Yes [provider]  EPINEPHrine 0.3 mg/0.3 mL IJ SOAJ injection Inject 0.3 mg into the muscle as needed for anaphylaxis.    [provider]  levothyroxine (SYNTHROID, LEVOTHROID) 50 MCG tablet Take 50 mcg by mouth daily before breakfast. One tablet Saturday and Sunday    [provider]    Allergies as of 03/10/2024   (No Known Allergies)    Family History  Problem Relation Age of Onset   Breast cancer Maternal Aunt 43    Social History   Socioeconomic History   Marital status: Married    Spouse name: Not on file   Number of children: Not on file   Years of education: Not on file   Highest education level: Not on file  Occupational History   Not on file  Tobacco Use   Smoking status: Never   Smokeless tobacco: Never  Vaping Use   Vaping status: Never Used  Substance and Sexual Activity   Alcohol use: Yes    Comment: occasionally   Drug use: Never   Sexual activity: Not on file  Other Topics Concern   Not on file  Social History Narrative   ** Merged History Encounter **       Social Drivers of Health   Financial Resource Strain: Low Risk  (12/07/2023)   Received from [facility]   Overall Financial Resource Strain (CARDIA)    Difficulty of Paying Living Expenses: Not hard at all  Food Insecurity: No Food Insecurity (12/07/2023)   [facility]   Hunger Vital Sign    Within the past 12 months, you worried that your food would run out before you got the money to buy more.: Never true    Within the past 12 months, the food you bought just didn't last and you didn't have money to get more.: Never true  Transportation Needs: No Transportation Needs (12/07/2023)   Received from [facility] - Transportation    In the past 12 months, has lack of transportation kept you from medical appointments or from getting medications?: No    Lack of Transportation (Non-Medical): No  Physical Activity: Not  on file  Stress: Not on file  Social Connections: Not on file  Intimate Partner Violence: Not on file    Review of Systems: See HPI, otherwise negative ROS  Physical Exam: BP (!) 150/75   Temp 97.8 F (36.6 C) (Temporal)   Resp 12   Ht 5' 2.01 (1.575 m)   Wt 61.4 kg   SpO2 100%   BMI 24.74 kg/m  General:   Alert, cooperative. Head:  Normocephalic and atraumatic. Respiratory:  Normal work of breathing. Cardiovascular:  NAD  Impression/Plan: Terri Jefferson is here for cataract surgery.  Risks, benefits, limitations, and alternatives regarding cataract surgery have been reviewed with the patient.  Questions have been answered.  All parties agreeable.   Adine Novak, MD  03/30/2024, 11:13 AM

## 2024-03-30 NOTE — Transfer of Care (Signed)
 Immediate Anesthesia Transfer of Care Note  Patient: Terri Jefferson  Procedure(s) Performed: PHACOEMULSIFICATION, CATARACT, WITH IOL INSERTION 4.98, 00:31.7 (Left)  Patient Location: PACU  Anesthesia Type: MAC  Level of Consciousness: awake, alert  and patient cooperative  Airway and Oxygen Therapy: Patient Spontanous Breathing   Post-op Assessment: Post-op Vital signs reviewed, Patient's Cardiovascular Status Stable, Respiratory Function Stable, Patent Airway and No signs of Nausea or vomiting  Post-op Vital Signs: Reviewed and stable  Complications: No notable events documented.

## 2024-03-30 NOTE — Op Note (Signed)
 OPERATIVE NOTE  Terri Jefferson [id number] 03/30/2024   PREOPERATIVE DIAGNOSIS:  Nuclear sclerotic cataract left eye.  H25.12   POSTOPERATIVE DIAGNOSIS:    Nuclear sclerotic cataract left eye.     PROCEDURE:  Phacoemusification with posterior chamber intraocular lens placement of the left eye   LENS:   Implant Name Type Inv. Item Serial No. Manufacturer Lot No. LRB No. Used Action  LENS IOL TECNIS EYHANCE 15.0 - [id number] Intraocular Lens LENS IOL TECNIS EYHANCE 15.0 [id number] [employer]IGHT[employer]  Left 1 Implanted      Procedure(s): PHACOEMULSIFICATION, CATARACT, WITH IOL INSERTION 4.98, 00:31.7 (Left)  SURGEON:  Adine Novak, MD, MPH   ANESTHESIA:  Topical with tetracaine drops augmented with 1% preservative-free intracameral lidocaine .  ESTIMATED BLOOD LOSS: <1 mL   COMPLICATIONS:  None.   DESCRIPTION OF PROCEDURE:  The patient was identified in the holding room and transported to the operating room and placed in the supine position under the operating microscope.  The left eye was identified as the operative eye and it was prepped and draped in the usual sterile ophthalmic fashion.   A 1.0 millimeter clear-corneal paracentesis was made at the 5:00 position. 0.5 ml of preservative-free 1% lidocaine  with epinephrine was injected into the anterior chamber.  The anterior chamber was filled with viscoelastic.  A 2.4 millimeter keratome was used to make a near-clear corneal incision at the 2:00 position.  A curvilinear capsulorrhexis was made with a cystotome and capsulorrhexis forceps.  Balanced salt solution was used to hydrodissect and hydrodelineate the nucleus.   Phacoemulsification was then used in stop and chop fashion to remove the lens nucleus and epinucleus.  The remaining cortex was then removed using the irrigation and aspiration handpiece. Viscoelastic was then placed into the capsular bag to distend it for lens placement.  A lens was then injected into the capsular bag.  The  remaining viscoelastic was aspirated.   Wounds were hydrated with balanced salt solution.  The anterior chamber was inflated to a physiologic pressure with balanced salt solution.  Intracameral vigamox 0.1 mL undiltued was injected into the eye and a drop placed onto the ocular surface.  No wound leaks were noted.  The patient was taken to the recovery room in stable condition without complications of anesthesia or surgery  Adine Novak 03/30/2024, 11:38 AM

## 2024-03-30 NOTE — Anesthesia Postprocedure Evaluation (Signed)
 Anesthesia Post Note  Patient: Terri Jefferson  Procedure(s) Performed: PHACOEMULSIFICATION, CATARACT, WITH IOL INSERTION 4.98, 00:31.7 (Left)  Patient location during evaluation: PACU Anesthesia Type: MAC Level of consciousness: awake and alert Pain management: pain level controlled Vital Signs Assessment: post-procedure vital signs reviewed and stable Respiratory status: spontaneous breathing, nonlabored ventilation, respiratory function stable and patient connected to nasal cannula oxygen Cardiovascular status: stable and blood pressure returned to baseline Postop Assessment: no apparent nausea or vomiting Anesthetic complications: no   No notable events documented.   Last Vitals:  Vitals:   03/30/24 1141 03/30/24 1144  BP: 119/77 126/72  Pulse: 79 74  Resp: (!) 21 12  Temp:    SpO2: 99% 98%    Last Pain:  Vitals:   03/30/24 1144  TempSrc:   PainSc: 0-No pain                 Shantale Holtmeyer C Natascha Edmonds

## 2024-03-31 DIAGNOSIS — H25011 Cortical age-related cataract, right eye: Secondary | ICD-10-CM | POA: Diagnosis not present

## 2024-03-31 DIAGNOSIS — H2511 Age-related nuclear cataract, right eye: Secondary | ICD-10-CM | POA: Diagnosis not present

## 2024-04-02 DIAGNOSIS — J3081 Allergic rhinitis due to animal (cat) (dog) hair and dander: Secondary | ICD-10-CM | POA: Diagnosis not present

## 2024-04-02 DIAGNOSIS — J301 Allergic rhinitis due to pollen: Secondary | ICD-10-CM | POA: Diagnosis not present

## 2024-04-02 DIAGNOSIS — J3089 Other allergic rhinitis: Secondary | ICD-10-CM | POA: Diagnosis not present

## 2024-04-02 NOTE — Discharge Instructions (Signed)

## 2024-04-06 NOTE — Anesthesia Preprocedure Evaluation (Addendum)
 Anesthesia Evaluation  Patient identified by MRN, date of birth, ID band Patient awake    Reviewed: Allergy & Precautions, H&P , NPO status , Patient's Chart, lab work & pertinent test results  Airway Mallampati: III  TM Distance: <3 FB Neck ROM: Full    Dental no notable dental hx. (+) Caps Teeth very crooked, none loose, none chipped, has two crowns, but none in front, not sure exact placement of crowns: :   Pulmonary neg pulmonary ROS   Pulmonary exam normal breath sounds clear to auscultation       Cardiovascular hypertension, + CAD  negative cardio ROS Normal cardiovascular exam Rhythm:Regular Rate:Normal  09-17-17 cath             The left ventricular systolic function is normal. LV end diastolic pressure is normal. The left ventricular ejection fraction is 55-65% by visual estimate. There is no mitral valve regurgitation. There is no aortic valve stenosis. There is no mitral valve stenosis and no mitral valve prolapse evident.   Normal lv function Normal coronary arteries. Risk factor optimization        Neuro/Psych negative neurological ROS  negative psych ROS   GI/Hepatic negative GI ROS, Neg liver ROS,,,  Endo/Other  negative endocrine ROSHypothyroidism    Renal/GU negative Renal ROS  negative genitourinary   Musculoskeletal negative musculoskeletal ROS (+) Arthritis ,    Abdominal   Peds negative pediatric ROS (+)  Hematology negative hematology ROS (+)   Anesthesia Other Findings Previous cataract surgery 03-30-24 Dr. Ola  Hypothyroidism             Arthritis Hypertension Fuchs' corneal dystrophy History of shingles       Hypercholesterolemia Coronary artery disease             Allergic rhinitis   Reproductive/Obstetrics negative OB ROS                              Anesthesia Physical Anesthesia Plan  ASA: 2  Anesthesia Plan: MAC   Post-op Pain  Management:    Induction: Intravenous  PONV Risk Score and Plan:   Airway Management Planned: Natural Airway and Nasal Cannula  Additional Equipment:   Intra-op Plan:   Post-operative Plan:   Informed Consent: I have reviewed the patients History and Physical, chart, labs and discussed the procedure including the risks, benefits and alternatives for the proposed anesthesia with the patient or authorized representative who has indicated his/her understanding and acceptance.     Dental Advisory Given  Plan Discussed with: Anesthesiologist, CRNA and Surgeon  Anesthesia Plan Comments: (Patient consented for risks of anesthesia including but not limited to:  - adverse reactions to medications - damage to eyes, teeth, lips or other oral mucosa - nerve damage due to positioning  - sore throat or hoarseness - Damage to heart, brain, nerves, lungs, other parts of body or loss of life  Patient voiced understanding and assent.)         Anesthesia Quick Evaluation

## 2024-04-13 ENCOUNTER — Ambulatory Visit: Payer: Self-pay | Admitting: Anesthesiology

## 2024-04-13 ENCOUNTER — Other Ambulatory Visit: Payer: Self-pay

## 2024-04-13 ENCOUNTER — Ambulatory Visit
Admission: RE | Admit: 2024-04-13 | Discharge: 2024-04-13 | Disposition: A | Discharge status: Home / Self Care | Attending: Ophthalmology | Admitting: Ophthalmology

## 2024-04-13 ENCOUNTER — Encounter
Admission: RE | Disposition: A | Discharge status: Home / Self Care | Payer: Self-pay | Source: Home / Self Care | Attending: Ophthalmology

## 2024-04-13 ENCOUNTER — Encounter: Payer: Self-pay | Admitting: Ophthalmology

## 2024-04-13 DIAGNOSIS — I10 Essential (primary) hypertension: Secondary | ICD-10-CM | POA: Diagnosis not present

## 2024-04-13 DIAGNOSIS — E7800 Pure hypercholesterolemia, unspecified: Secondary | ICD-10-CM | POA: Diagnosis not present

## 2024-04-13 DIAGNOSIS — H2511 Age-related nuclear cataract, right eye: Secondary | ICD-10-CM | POA: Diagnosis not present

## 2024-04-13 DIAGNOSIS — I2510 Atherosclerotic heart disease of native coronary artery without angina pectoris: Secondary | ICD-10-CM | POA: Diagnosis not present

## 2024-04-13 DIAGNOSIS — H25811 Combined forms of age-related cataract, right eye: Secondary | ICD-10-CM | POA: Diagnosis not present

## 2024-04-13 DIAGNOSIS — H25011 Cortical age-related cataract, right eye: Secondary | ICD-10-CM | POA: Diagnosis not present

## 2024-04-13 DIAGNOSIS — E039 Hypothyroidism, unspecified: Secondary | ICD-10-CM | POA: Diagnosis not present

## 2024-04-13 HISTORY — PX: CATARACT EXTRACTION W/PHACO: SHX586

## 2024-04-13 SURGERY — PHACOEMULSIFICATION, CATARACT, WITH IOL INSERTION
Anesthesia: Monitor Anesthesia Care | Site: Eye | Laterality: Right

## 2024-04-13 MED ORDER — SIGHTPATH DOSE#1 BSS IO SOLN
INTRAOCULAR | Status: DC | PRN
  Administered 2024-04-13: 74 mL via OPHTHALMIC

## 2024-04-13 MED ORDER — MIDAZOLAM HCL (PF) 2 MG/2ML IJ SOLN
INTRAMUSCULAR | Status: DC | PRN
  Administered 2024-04-13: 2 mg via INTRAVENOUS

## 2024-04-13 MED ORDER — FENTANYL CITRATE (PF) 100 MCG/2ML IJ SOLN
INTRAMUSCULAR | Status: DC | PRN
  Administered 2024-04-13: 50 ug via INTRAVENOUS

## 2024-04-13 MED ORDER — SIGHTPATH DOSE#1 BSS IO SOLN
INTRAOCULAR | Status: DC | PRN
  Administered 2024-04-13: 15 mL via INTRAOCULAR

## 2024-04-13 MED ORDER — SIGHTPATH DOSE#1 NA HYALUR & NA CHOND-NA HYALUR IO KIT
PACK | INTRAOCULAR | Status: DC | PRN
  Administered 2024-04-13: 1 via OPHTHALMIC

## 2024-04-13 MED ORDER — TETRACAINE HCL 0.5 % OP SOLN
1.0000 [drp] | OPHTHALMIC | Status: DC | PRN
  Administered 2024-04-13 (×3): 1 [drp] via OPHTHALMIC

## 2024-04-13 MED ORDER — MIDAZOLAM HCL 2 MG/2ML IJ SOLN
INTRAMUSCULAR | Status: AC
  Filled 2024-04-13: qty 2

## 2024-04-13 MED ORDER — PHENYLEPHRINE HCL 10 % OP SOLN
1.0000 [drp] | OPHTHALMIC | Status: AC
  Administered 2024-04-13 (×2): 1 [drp] via OPHTHALMIC

## 2024-04-13 MED ORDER — PHENYLEPHRINE HCL 10 % OP SOLN
OPHTHALMIC | Status: AC
  Filled 2024-04-13: qty 5

## 2024-04-13 MED ORDER — LACTATED RINGERS IV SOLN: INTRAVENOUS | Status: DC

## 2024-04-13 MED ORDER — LIDOCAINE HCL (PF) 2 % IJ SOLN
INTRAOCULAR | Status: DC | PRN
  Administered 2024-04-13: 4 mL via INTRAOCULAR

## 2024-04-13 MED ORDER — FENTANYL CITRATE (PF) 100 MCG/2ML IJ SOLN
INTRAMUSCULAR | Status: AC
  Filled 2024-04-13: qty 2

## 2024-04-13 MED ORDER — TETRACAINE HCL 0.5 % OP SOLN
OPHTHALMIC | Status: AC
  Filled 2024-04-13: qty 4

## 2024-04-13 MED ORDER — CYCLOPENTOLATE HCL 2 % OP SOLN
OPHTHALMIC | Status: AC
  Filled 2024-04-13: qty 2

## 2024-04-13 MED ORDER — CYCLOPENTOLATE HCL 2 % OP SOLN
1.0000 [drp] | OPHTHALMIC | Status: AC
  Administered 2024-04-13 (×2): 1 [drp] via OPHTHALMIC

## 2024-04-13 MED ORDER — MOXIFLOXACIN HCL 0.5 % OP SOLN
OPHTHALMIC | Status: DC | PRN
  Administered 2024-04-13: .2 mL via OPHTHALMIC

## 2024-04-13 SURGICAL SUPPLY — 8 items
DISSECTOR HYDRO NUCLEUS 50X22 (MISCELLANEOUS) ×1 IMPLANT
FEE CATARACT SUITE SIGHTPATH (MISCELLANEOUS) ×1 IMPLANT
GLOVE PI ULTRA LF STRL 7.5 (GLOVE) ×1 IMPLANT
GLOVE SURG SYN 6.5 PF PI BL (GLOVE) ×1 IMPLANT
GLOVE SURG SYN 8.5 PF PI BL (GLOVE) ×1 IMPLANT
LENS IOL TECNIS EYHANCE 16.5 (Intraocular Lens) IMPLANT
NDL FILTER BLUNT 18X1 1/2 (NEEDLE) ×1 IMPLANT
SYR 3ML LL SCALE MARK (SYRINGE) ×1 IMPLANT

## 2024-04-13 NOTE — Op Note (Signed)
 OPERATIVE NOTE  Terri Jefferson [id number] 04/13/2024   PREOPERATIVE DIAGNOSIS:  Nuclear sclerotic cataract right eye.  H25.11   POSTOPERATIVE DIAGNOSIS:    Nuclear sclerotic cataract right eye.     PROCEDURE:  Phacoemusification with posterior chamber intraocular lens placement of the right eye   LENS:   Implant Name Type Inv. Item Serial No. Manufacturer Lot No. LRB No. Used Action  LENS IOL TECNIS EYHANCE 16.5 - [id number] Intraocular Lens LENS IOL TECNIS EYHANCE 16.5 [id number] [employer]  Right 1 Implanted       Procedure(s): PHACOEMULSIFICATION, CATARACT, WITH IOL INSERTION 6.40 00:37.1 (Right)  SURGEON:  Adine Novak, MD, MPH  ANESTHESIOLOGIST: Anesthesiologist: Ola Donny BROCKS, MD CRNA: Veronica Alm BROCKS, CRNA   ANESTHESIA:  Topical with tetracaine  drops augmented with 1% preservative-free intracameral lidocaine .  ESTIMATED BLOOD LOSS: less than 1 mL.   COMPLICATIONS:  None.   DESCRIPTION OF PROCEDURE:  The patient was identified in the holding room and transported to the operating room and placed in the supine position under the operating microscope.  The right eye was identified as the operative eye and it was prepped and draped in the usual sterile ophthalmic fashion.   A 1.0 millimeter clear-corneal paracentesis was made at the 10:30 position. 0.5 ml of preservative-free 1% lidocaine  with epinephrine  was injected into the anterior chamber.  The anterior chamber was filled with viscoelastic.  A 2.4 millimeter keratome was used to make a near-clear corneal incision at the 8:00 position.  A curvilinear capsulorrhexis was made with a cystotome and capsulorrhexis forceps.  Balanced salt solution was used to hydrodissect and hydrodelineate the nucleus.   Phacoemulsification was then used in stop and chop fashion to remove the lens nucleus and epinucleus.  The remaining cortex was then removed using the irrigation and aspiration handpiece. Viscoelastic was then placed into the  capsular bag to distend it for lens placement.  A lens was then injected into the capsular bag.  The remaining viscoelastic was aspirated.   Wounds were hydrated with balanced salt solution.  The anterior chamber was inflated to a physiologic pressure with balanced salt solution.   Intracameral vigamox  0.1 mL undiluted was injected into the eye and a drop placed onto the ocular surface.  No wound leaks were noted.  The patient was taken to the recovery room in stable condition without complications of anesthesia or surgery  Adine Novak 04/13/2024, 12:43 PM

## 2024-04-13 NOTE — Transfer of Care (Signed)
 Immediate Anesthesia Transfer of Care Note  Patient: Terri Jefferson  Procedure(s) Performed: PHACOEMULSIFICATION, CATARACT, WITH IOL INSERTION 6.40 00:37.1 (Right: Eye)  Patient Location: PACU  Anesthesia Type: MAC  Level of Consciousness: awake, alert  and patient cooperative  Airway and Oxygen Therapy: Patient Spontanous Breathing   Post-op Assessment: Post-op Vital signs reviewed, Patient's Cardiovascular Status Stable, Respiratory Function Stable, Patent Airway and No signs of Nausea or vomiting  Post-op Vital Signs: Reviewed and stable  Complications: No notable events documented.

## 2024-04-13 NOTE — Anesthesia Postprocedure Evaluation (Signed)
 Anesthesia Post Note  Patient: Terri Jefferson  Procedure(s) Performed: PHACOEMULSIFICATION, CATARACT, WITH IOL INSERTION 6.40 00:37.1 (Right: Eye)  Patient location during evaluation: PACU Anesthesia Type: MAC Level of consciousness: awake and alert Pain management: pain level controlled Vital Signs Assessment: post-procedure vital signs reviewed and stable Respiratory status: spontaneous breathing, nonlabored ventilation, respiratory function stable and patient connected to nasal cannula oxygen Cardiovascular status: stable and blood pressure returned to baseline Postop Assessment: no apparent nausea or vomiting Anesthetic complications: no   No notable events documented.   Last Vitals:  Vitals:   04/13/24 1245 04/13/24 1250  BP: 114/65 125/73  Pulse: 72 69  Resp: 13 17  Temp: 36.8 C   SpO2: 99% 97%    Last Pain:  Vitals:   04/13/24 1250  TempSrc:   PainSc: 0-No pain                 Taneal Sonntag C Alease Fait

## 2024-04-13 NOTE — H&P (Signed)
 [facility]   Primary Care Physician:  Alla Amis, MD Ophthalmologist: Dr. Adine Novak  Pre-Procedure History & Physical: HPI:  Terri Jefferson is a 73 y.o. female here for cataract surgery.   Past Medical History:  Diagnosis Date   Allergic rhinitis    Arthritis    Coronary artery disease    Fuchs' corneal dystrophy    History of shingles    Hypercholesterolemia    Hypertension    Hypothyroidism     Past Surgical History:  Procedure Laterality Date   CATARACT EXTRACTION W/PHACO Left 03/30/2024   Procedure: PHACOEMULSIFICATION, CATARACT, WITH IOL INSERTION 4.98, 00:31.7;  Surgeon: Novak Adine Anes, MD;  Location: [facility];  Service: Ophthalmology;  Laterality: Left;   COLONOSCOPY WITH PROPOFOL  N/A 10/05/2019   Procedure: COLONOSCOPY WITH PROPOFOL ;  Surgeon: Toledo, Ladell POUR, MD;  Location: [facility];  Service: Gastroenterology;  Laterality: N/A;   LEFT HEART CATH AND CORONARY ANGIOGRAPHY Left 09/17/2017   Procedure: LEFT HEART CATH AND CORONARY ANGIOGRAPHY;  Surgeon: Bosie Vinie LABOR, MD;  Location: [facility];  Service: Cardiovascular;  Laterality: Left;   THYROIDECTOMY  1985   TONSILLECTOMY      Prior to Admission medications   Medication Sig Start Date End Date Taking? Authorizing Provider  atorvastatin (LIPITOR) 10 MG tablet Take 10 mg by mouth daily.   Yes [provider]  Calcium Carbonate-Vitamin D (CALTRATE 600+D PO) Take by mouth daily.   Yes [provider]  EPINEPHrine  0.3 mg/0.3 mL IJ SOAJ injection Inject 0.3 mg into the muscle as needed for anaphylaxis.   Yes [provider]  levothyroxine (SYNTHROID) 75 MCG tablet Take 75 mcg by mouth daily before breakfast. One tablet Monday through Friday   Yes [provider]  levothyroxine (SYNTHROID, LEVOTHROID) 50 MCG tablet Take 50 mcg by mouth daily before breakfast. One tablet Saturday and Sunday   Yes [provider]   lisinopril-hydrochlorothiazide (PRINZIDE,ZESTORETIC) 10-12.5 MG tablet Take 1 tablet by mouth daily.   Yes [provider]  loratadine (ALAVERT) 10 MG tablet Take 10 mg by mouth daily.   Yes [provider]  metoprolol succinate (TOPROL-XL) 25 MG 24 hr tablet Take 25 mg by mouth daily.   Yes [provider]  Multiple Vitamins-Minerals (MULTIVITAMIN PO) Take 1 tablet by mouth daily.   Yes [provider]  Probiotic Product (PROBIOTIC DAILY PO) Take 1 tablet by mouth daily.   Yes [provider]    Allergies as of 03/10/2024   (No Known Allergies)    Family History  Problem Relation Age of Onset   Breast cancer Maternal Aunt 81    Social History   Socioeconomic History   Marital status: Married    Spouse name: Not on file   Number of children: Not on file   Years of education: Not on file   Highest education level: Not on file  Occupational History   Not on file  Tobacco Use   Smoking status: Never   Smokeless tobacco: Never  Vaping Use   Vaping status: Never Used  Substance and Sexual Activity   Alcohol use: Yes    Comment: occasionally   Drug use: Never   Sexual activity: Not on file  Other Topics Concern   Not on file  Social History Narrative   ** Merged History Encounter **       Social Drivers of Health   Financial Resource Strain: Low Risk  (12/07/2023)   Received from [facility] [place]  [facility]   Overall Financial Resource Strain (CARDIA)    Difficulty of Paying Living Expenses: Not hard at all  Food Insecurity: No Food Insecurity (12/07/2023)   Received from [facility]   Hunger Vital Sign    Within the past 12 months, you worried that your food would run out before you got the money to buy more.: Never true    Within the past 12 months, the food you bought just didn't last and you didn't have money to get more.: Never true  Transportation Needs: No Transportation Needs (12/07/2023)    Received from [facility] - [facility]    In the past 12 months, has lack of transportation kept you from medical appointments or from getting medications?: No    Lack of Transportation (Non-Medical): No  Physical Activity: Not on file  Stress: Not on file  Social Connections: Not on file  Intimate Partner Violence: Not on file    Review of Systems: See HPI, otherwise negative ROS  Physical Exam: BP 138/68   Pulse 74   Temp 97.7 F (36.5 C) (Temporal)   Resp 12   Ht 5' 2.01 (1.575 m)   Wt 60.9 kg   SpO2 100%   BMI 24.54 kg/m  General:   Alert, cooperative. Head:  Normocephalic and atraumatic. Respiratory:  Normal work of breathing. Cardiovascular:  NAD  Impression/Plan: Terri Jefferson is here for cataract surgery.  Risks, benefits, limitations, and alternatives regarding cataract surgery have been reviewed with the patient.  Questions have been answered.  All parties agreeable.   Adine Novak, MD  04/13/2024, 11:57 AM

## 2024-04-16 DIAGNOSIS — J3089 Other allergic rhinitis: Secondary | ICD-10-CM | POA: Diagnosis not present

## 2024-04-16 DIAGNOSIS — J3081 Allergic rhinitis due to animal (cat) (dog) hair and dander: Secondary | ICD-10-CM | POA: Diagnosis not present

## 2024-04-16 DIAGNOSIS — J301 Allergic rhinitis due to pollen: Secondary | ICD-10-CM | POA: Diagnosis not present
